# Patient Record
Sex: Male | Born: 1945 | Race: White | Hispanic: No | Smoking: Former smoker
Health system: Southern US, Community
[De-identification: ages and names within clinical notes are randomized; demographics above are authoritative.]

## PROBLEM LIST (undated history)

## (undated) DIAGNOSIS — E785 Hyperlipidemia, unspecified: Secondary | ICD-10-CM

## (undated) DIAGNOSIS — E119 Type 2 diabetes mellitus without complications: Secondary | ICD-10-CM

## (undated) DIAGNOSIS — I10 Essential (primary) hypertension: Secondary | ICD-10-CM

## (undated) DIAGNOSIS — I2510 Atherosclerotic heart disease of native coronary artery without angina pectoris: Secondary | ICD-10-CM

## (undated) DIAGNOSIS — I219 Acute myocardial infarction, unspecified: Secondary | ICD-10-CM

## (undated) HISTORY — DX: Hyperlipidemia, unspecified: E78.5

## (undated) HISTORY — DX: Essential (primary) hypertension: I10

## (undated) HISTORY — PX: CARDIAC SURGERY: SHX584

## (undated) HISTORY — DX: Acute myocardial infarction, unspecified: I21.9

## (undated) HISTORY — DX: Type 2 diabetes mellitus without complications: E11.9

## (undated) HISTORY — PX: TONSILLECTOMY AND ADENOIDECTOMY: SUR1326

## (undated) HISTORY — DX: Atherosclerotic heart disease of native coronary artery without angina pectoris: I25.10

---

## 1998-03-06 ENCOUNTER — Ambulatory Visit (HOSPITAL_BASED_OUTPATIENT_CLINIC_OR_DEPARTMENT_OTHER): Admission: RE | Admit: 1998-03-06 | Discharge: 1998-03-06 | Payer: Self-pay | Admitting: Urology

## 2004-03-29 ENCOUNTER — Ambulatory Visit: Payer: Self-pay | Admitting: Cardiology

## 2004-04-11 ENCOUNTER — Ambulatory Visit: Payer: Self-pay

## 2005-06-18 ENCOUNTER — Ambulatory Visit: Payer: Self-pay | Admitting: Cardiology

## 2005-12-10 ENCOUNTER — Observation Stay (HOSPITAL_COMMUNITY): Admission: EM | Admit: 2005-12-10 | Discharge: 2005-12-11 | Payer: Self-pay | Admitting: Emergency Medicine

## 2005-12-10 ENCOUNTER — Ambulatory Visit: Payer: Self-pay | Admitting: Cardiovascular Disease

## 2005-12-16 ENCOUNTER — Ambulatory Visit: Payer: Self-pay

## 2006-06-22 ENCOUNTER — Ambulatory Visit: Payer: Self-pay | Admitting: Cardiology

## 2006-09-20 IMAGING — CR DG CHEST 1V PORT
1 series · 1 of 1 positions shown · non-contrast
Comparison: none
 The heart size and mediastinal contours are within normal limits.  Both lungs are clear.

CLINICAL DATA: Chest pain.
 PORTABLE CHEST - 1 VIEW:

[view not recorded]
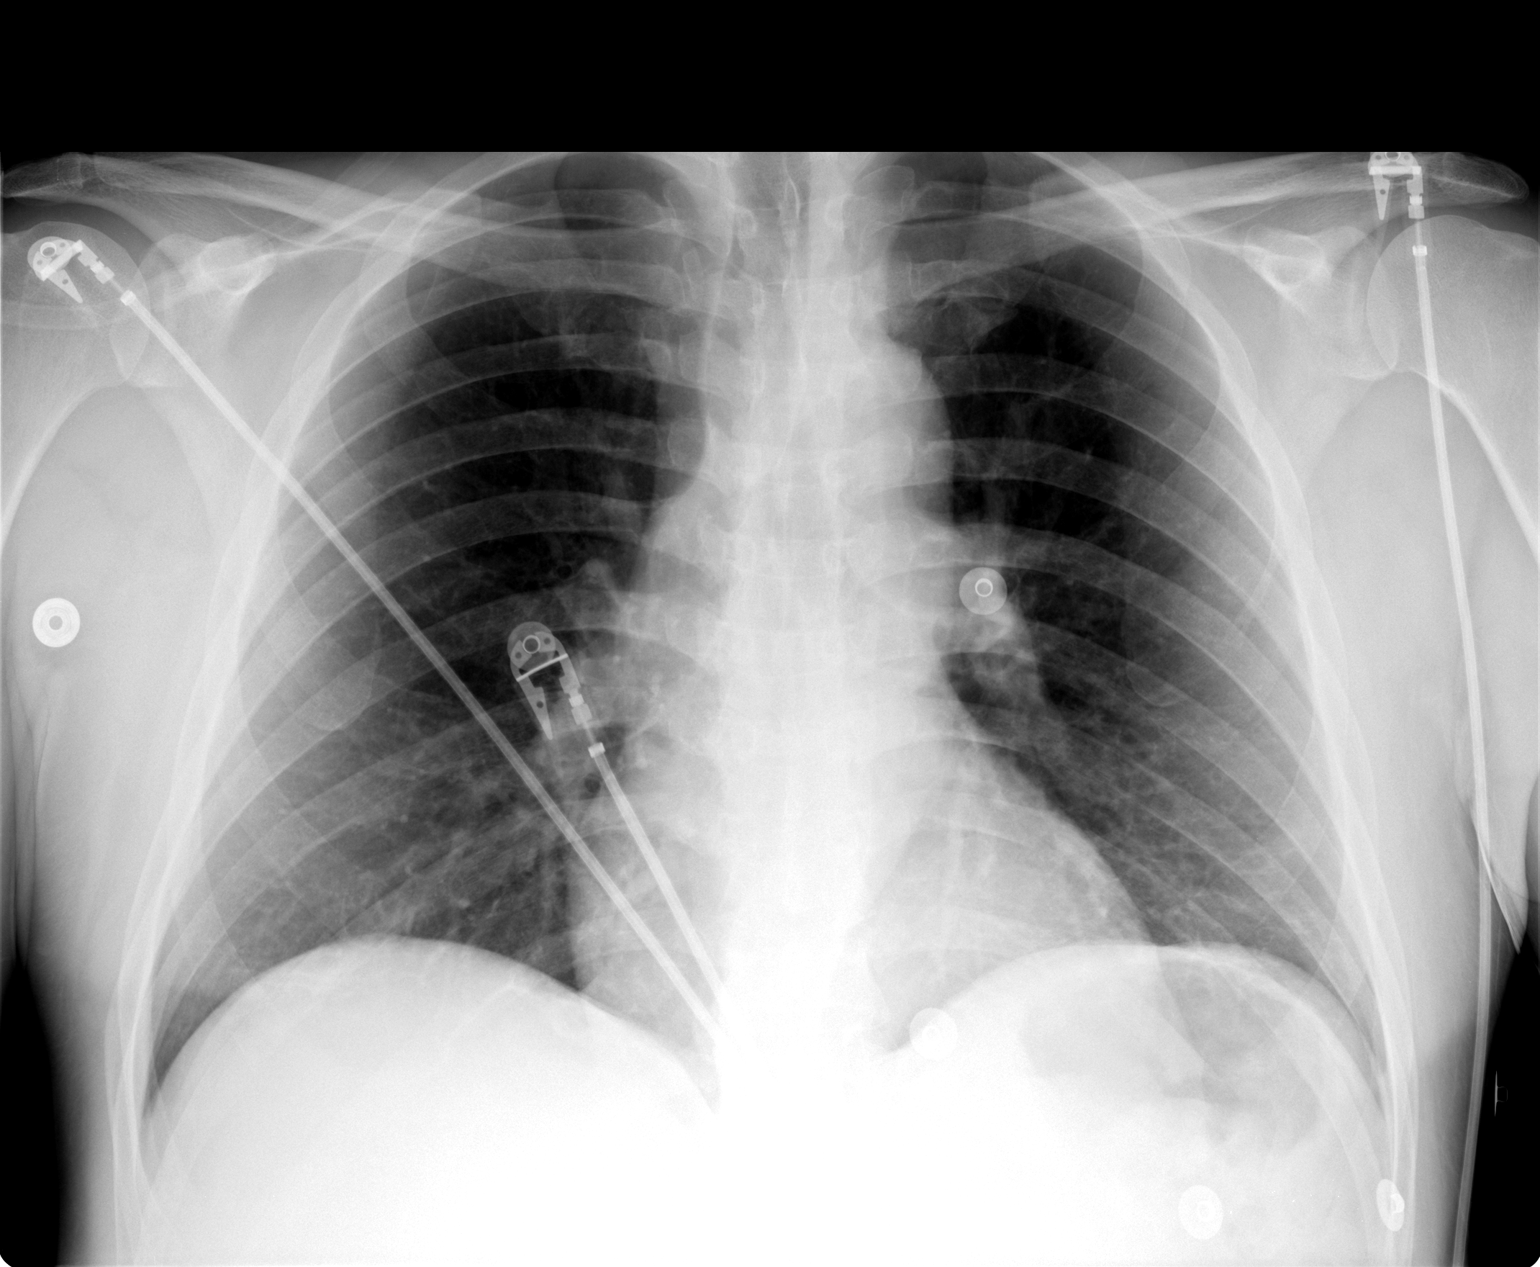

[1 of 1 positions shown; findings below may reference images not displayed]

IMPRESSION: No acute findings.

## 2006-12-10 ENCOUNTER — Ambulatory Visit: Payer: Self-pay | Admitting: Cardiology

## 2007-03-30 ENCOUNTER — Ambulatory Visit: Payer: Self-pay | Admitting: Cardiology

## 2007-06-29 ENCOUNTER — Ambulatory Visit: Payer: Self-pay | Admitting: Cardiology

## 2007-07-28 ENCOUNTER — Ambulatory Visit: Payer: Self-pay | Admitting: Cardiology

## 2007-11-24 ENCOUNTER — Ambulatory Visit: Payer: Self-pay | Admitting: Cardiology

## 2007-12-08 ENCOUNTER — Ambulatory Visit: Payer: Self-pay | Admitting: Cardiology

## 2007-12-08 LAB — CONVERTED CEMR LAB
BUN: 17 mg/dL (ref 6–23)
CO2: 29 meq/L (ref 19–32)
Calcium: 9.7 mg/dL (ref 8.4–10.5)
Creatinine, Ser: 0.9 mg/dL (ref 0.4–1.5)
Glucose, Bld: 126 mg/dL — ABNORMAL HIGH (ref 70–99)

## 2007-12-13 ENCOUNTER — Ambulatory Visit: Payer: Self-pay | Admitting: Cardiology

## 2008-03-29 ENCOUNTER — Ambulatory Visit: Payer: Self-pay | Admitting: Cardiology

## 2008-03-31 ENCOUNTER — Ambulatory Visit: Payer: Self-pay

## 2008-06-22 ENCOUNTER — Encounter: Payer: Self-pay | Admitting: Cardiology

## 2008-08-16 ENCOUNTER — Ambulatory Visit: Payer: Self-pay | Admitting: Cardiology

## 2008-09-06 ENCOUNTER — Encounter: Payer: Self-pay | Admitting: Cardiology

## 2008-12-01 ENCOUNTER — Encounter (INDEPENDENT_AMBULATORY_CARE_PROVIDER_SITE_OTHER): Payer: Self-pay | Admitting: *Deleted

## 2008-12-30 DIAGNOSIS — E782 Mixed hyperlipidemia: Secondary | ICD-10-CM

## 2008-12-30 DIAGNOSIS — E119 Type 2 diabetes mellitus without complications: Secondary | ICD-10-CM

## 2008-12-30 DIAGNOSIS — I10 Essential (primary) hypertension: Secondary | ICD-10-CM

## 2008-12-30 HISTORY — DX: Mixed hyperlipidemia: E78.2

## 2008-12-30 HISTORY — DX: Essential (primary) hypertension: I10

## 2009-01-03 ENCOUNTER — Ambulatory Visit: Payer: Self-pay | Admitting: Cardiology

## 2009-01-25 ENCOUNTER — Ambulatory Visit: Payer: Self-pay | Admitting: Cardiology

## 2009-04-10 ENCOUNTER — Ambulatory Visit: Payer: Self-pay | Admitting: Cardiology

## 2009-04-13 ENCOUNTER — Encounter (INDEPENDENT_AMBULATORY_CARE_PROVIDER_SITE_OTHER): Payer: Self-pay | Admitting: *Deleted

## 2009-06-20 ENCOUNTER — Encounter: Payer: Self-pay | Admitting: Cardiology

## 2009-08-09 ENCOUNTER — Ambulatory Visit: Payer: Self-pay | Admitting: Cardiology

## 2009-10-15 ENCOUNTER — Encounter: Payer: Self-pay | Admitting: Cardiology

## 2009-12-05 ENCOUNTER — Ambulatory Visit: Payer: Self-pay | Admitting: Cardiology

## 2009-12-19 DIAGNOSIS — I2510 Atherosclerotic heart disease of native coronary artery without angina pectoris: Secondary | ICD-10-CM

## 2009-12-19 HISTORY — DX: Atherosclerotic heart disease of native coronary artery without angina pectoris: I25.10

## 2009-12-21 ENCOUNTER — Ambulatory Visit: Payer: Self-pay | Admitting: Cardiology

## 2010-04-10 ENCOUNTER — Ambulatory Visit: Payer: Self-pay | Admitting: Cardiology

## 2010-06-13 NOTE — Assessment & Plan Note (Signed)
Summary: PER CHECK OUT/SF  Medications Added GLIMEPIRIDE 4 MG TABS (GLIMEPIRIDE) 1 tab once daily      Allergies Added: NKDA  Visit Type:  1 yr f/u Primary Provider:  Dr. Quintella Riddle  CC:  no cardiac complaints today.  History of Present Illness: [name] returns today for further evaluation and management of his coronary artery disease.  He lost his job as well as his insurance. He is very stressed out.  Denies any angina or ischemic symptoms. He is compliant with his medications.  Last laboratory data I have is from April 2010. Lipitor goal. Hemoglobin A1c was 6.7% on metformin and Actos.  He is no longer on Actos.   Clinical Reports Reviewed:  Cardiac Cath:  12/11/2005: Cardiac Cath Findings:   Left ventricular function is normal.  The left ventricular ejection fraction  is 60% by LV angiography.   ASSESSMENT:  1.  Diffuse coronary artery disease in a mild to moderate range of severity.  2.  Preserved left ventricular function.  Nuclear Study:  03/31/2008:  Excerise capacity: Excellent exercise capacity   Blood Pressure response: Hypertensive blood pressure response (184/79).  Clinical symptoms: Dyspnea without chest pain  ECG impression: Significant ST abnormalities consistent with ischemia  Overall impression: Abnormal stress nuclear study. Electrocardiographically abnormal in the absence of chest pain. There is apparent basal inferior scar without any large degree of ischemia.  Lance Sidle, MD   12/16/2005:  Excerise capacity: Good exercise capacity  Blood Pressure response: Mild hypertensive blood pressure response  Clinical symptoms: No chest pain or dyspnea  ECG impression: No ST abnormalities suggestive of ischemia  Overall impression: No ischemia. Slight decrease in thickening of the inferior wall at the base.  Willa Rough, MD   Current Medications (verified): 1)  Lisinopril-Hydrochlorothiazide 20-12.5 Mg Tabs  (Lisinopril-Hydrochlorothiazide) .Marland Kitchen.. 1 Tab Once Daily 2)  Aspirin 81 Mg Tbec (Aspirin) .... Take One Tablet By Mouth Daily 3)  Metformin Hcl 500 Mg Tabs (Metformin Hcl) .Marland Kitchen.. 1 Tab Two Times A Day 4)  Vitamin C 500 Mg Tabs (Ascorbic Acid) .Marland Kitchen.. 1 Tab Once Daily 5)  Multivitamins   Tabs (Multiple Vitamin) .Marland Kitchen.. 1 Tab Once Daily 6)  Norvasc 10 Mg Tabs (Amlodipine Besylate) .Marland Kitchen.. 1 Tab Once Daily 7)  Research Study Drug .... Take As Directed 8)  Nitroglycerin 0.4 Mg Subl (Nitroglycerin) .... One Tablet Under Tongue Every 5 Minutes As Needed For Chest Pain---May Repeat Times Three 9)  Glimepiride 4 Mg Tabs (Glimepiride) .Marland Kitchen.. 1 Tab Once Daily  Allergies (verified): No Known Drug Allergies  Past History:  Past Medical History: Last updated: 12/19/2009 CAD, NATIVE VESSEL (ICD-414.01) MYOCARDIAL INFARCTION/1994 (ICD-410.90) HYPERTENSION, UNSPECIFIED (ICD-401.9) HYPERLIPIDEMIA (ICD-272.4) DIABETES MELLITUS, TYPE II (ICD-250.00)    Family History: Last updated: 12/30/2008  remarkable for premature coronary artery disease on his  father's side.  Social History: Last updated: 12/30/2008 Tobacco Use - Yes.  pt chews  Risk Factors: Smoking Status: current (12/30/2008)  Review of Systems       negative other than history of present illness  Vital Signs:  Patient profile:   65 year old male Height:      65 inches Weight:      172.4 pounds BMI:     28.79 Pulse rate:   69 / minute Pulse rhythm:   regular BP sitting:   132 / 78  (left arm) Cuff size:   large  Vitals Entered By: Danielle Rankin, CMA (December 21, 2009 9:43 AM)  Physical Exam  General:  no acute distress, overweight Head:  normocephalic and atraumatic Eyes:  PERRLA/EOM intact; conjunctiva and lids normal. Neck:  Neck supple, no JVD. No masses, thyromegaly or abnormal cervical nodes. Chest Wall:  no deformities or breast masses noted Lungs:  Clear bilaterally to auscultation and percussion. Heart:  PMI nondisplaced,  regular rate and rhythm, no gallop. Normal S1-S2, carotid strokes equal bilateral without bruits Abdomen:  Bowel sounds positive; abdomen soft and non-tender without masses, organomegaly, or hernias noted. No hepatosplenomegaly. Msk:  Back normal, normal gait. Muscle strength and tone normal. Pulses:  pulses normal in all 4 extremities Extremities:  No clubbing or cyanosis. Neurologic:  Alert and oriented x 3. Skin:  Intact without lesions or rashes. Psych:  Normal affect.   EKG  Procedure date:  12/21/2009  Findings:      normal sinus rhythm, normal EKG  Impression & Recommendations:  Problem # 1:  CAD, NATIVE VESSEL (ICD-414.01) Assessment Unchanged stable. No indication for stress Myoview. Followup in a year. His updated medication list for this problem includes:    Lisinopril-hydrochlorothiazide 20-12.5 Mg Tabs (Lisinopril-hydrochlorothiazide) .Marland Kitchen... 1 tab once daily    Aspirin 81 Mg Tbec (Aspirin) .Marland Kitchen... Take one tablet by mouth daily    Norvasc 10 Mg Tabs (Amlodipine besylate) .Marland Kitchen... 1 tab once daily    Nitroglycerin 0.4 Mg Subl (Nitroglycerin) ..... One tablet under tongue every 5 minutes as needed for chest pain---may repeat times three  Problem # 2:  MYOCARDIAL INFARCTION/1994 (ICD-410.90) Assessment: Unchanged  His updated medication list for this problem includes:    Lisinopril-hydrochlorothiazide 20-12.5 Mg Tabs (Lisinopril-hydrochlorothiazide) .Marland Kitchen... 1 tab once daily    Aspirin 81 Mg Tbec (Aspirin) .Marland Kitchen... Take one tablet by mouth daily    Norvasc 10 Mg Tabs (Amlodipine besylate) .Marland Kitchen... 1 tab once daily    Nitroglycerin 0.4 Mg Subl (Nitroglycerin) ..... One tablet under tongue every 5 minutes as needed for chest pain---may repeat times three  Problem # 3:  HYPERTENSION, UNSPECIFIED (ICD-401.9) Assessment: Improved  His updated medication list for this problem includes:    Lisinopril-hydrochlorothiazide 20-12.5 Mg Tabs (Lisinopril-hydrochlorothiazide) .Marland Kitchen... 1 tab once  daily    Aspirin 81 Mg Tbec (Aspirin) .Marland Kitchen... Take one tablet by mouth daily    Norvasc 10 Mg Tabs (Amlodipine besylate) .Marland Kitchen... 1 tab once daily  Problem # 4:  HYPERLIPIDEMIA (ICD-272.4) his That primary care Will check and recently checked. I do not have the numbers.  Problem # 5:  DIABETES MELLITUS, TYPE II (ICD-250.00) Assessment: Deteriorated He has been advised to lose about 10 pounds. At that point we would obtain fasting lipids, comprehensive metabolic panel, hemoglobin A1c.He can have this done through his primary care doctor. The following medications were removed from the medication list:    Actos 45 Mg Tabs (Pioglitazone hcl) .Marland Kitchen... 1 tab once daily His updated medication list for this problem includes:    Lisinopril-hydrochlorothiazide 20-12.5 Mg Tabs (Lisinopril-hydrochlorothiazide) .Marland Kitchen... 1 tab once daily    Aspirin 81 Mg Tbec (Aspirin) .Marland Kitchen... Take one tablet by mouth daily    Metformin Hcl 500 Mg Tabs (Metformin hcl) .Marland Kitchen... 1 tab two times a day    Glimepiride 4 Mg Tabs (Glimepiride) .Marland Kitchen... 1 tab once daily  Patient Instructions: 1)  Your physician recommends that you schedule a follow-up appointment in: 1 year with Dr. Daleen Squibb 2)  Your physician recommends that you continue on your current medications as directed. Please refer to the Current Medication list given to you today. 3)  Your physician encouraged you to lose weight  for better health.  Try to lose 10 pounds over the next year. 4)  Follow up with your primary care doctor for lab work.

## 2010-08-08 ENCOUNTER — Encounter (INDEPENDENT_AMBULATORY_CARE_PROVIDER_SITE_OTHER): Payer: Self-pay

## 2010-08-08 DIAGNOSIS — R0989 Other specified symptoms and signs involving the circulatory and respiratory systems: Secondary | ICD-10-CM

## 2010-09-24 NOTE — Assessment & Plan Note (Signed)
[facility]                            CARDIOLOGY OFFICE NOTE   NAME:Lance Riddle, Lance Riddle                       MRN:          [id number]  DATE:12/13/2007                            DOB:          07/08/1945    Lance Riddle returns today for followup.   PROBLEM LIST:  1. Coronary artery disease.  He is status post acute inferior wall      infarct in 1994 with emergency angioplasty and V-fib arrest.  For      exertional angina, had a cardiac cath on December 11, 2005, which      showed nonobstructive disease, but diffuse disease and a small      evidence of right coronary that had about a 60-70% in the proximal      RCA.  Subsequent stress Myoview showed no ischemia.  He has done      remarkably well.  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  2. Hyperlipidemia, followed by Dr. Purnell Shoemaker.  3. Type 2 diabetes.  4. Hypertension.  5. Hyperlipidemia, on IMPROVE-IT study.   He denies any angina, shortness of breath, dyspnea on exertion,  orthopnea, PND, or peripheral edema.   MEDICATIONS:  1. Lisinopril HCTZ 20/12.5 daily.  2. Aspirin 81 mg a day.  3. Metformin 500 b.i.d.  4. Norvasc 10 mg a day.  5. Actos 45 mg a day.  6. IMPROVE-IT study drug.   He has some blood work done recently as a part of his study that showed  a potassium of 5.3 borderline high, repeated was 4.4.  His creatinine  was 0.9.   PHYSICAL EXAMINATION:  VITAL SIGNS:  Blood pressure is 110/60, his pulse  is 72 and regular, his weight is 168.  HEENT:  Normocephalic, atraumatic.  PERRLA.  Extraocular movements  intact.  Sclerae are clear.  Facial symmetry is normal.  NECK:  Carotid upstrokes are equal bilaterally without bruits, no JVD.  Thyroid is not enlarged.  Trachea is midline.  LUNGS:  Clear.  HEART:  Reveals a regular rate and rhythm.  No gallop, rub, or murmur.  ABDOMEN:  Soft.  Good bowel sounds.  No midline bruit, no hepatomegaly.  EXTREMITIES:  No cyanosis, clubbing, or edema.  Pulses are brisk.  NEURO:  Intact.  MUSCULOSKELETAL:  Intact.  SKIN:  Unremarkable.   ASSESSMENT AND PLAN:  Lance Riddle is doing well.  I have advised him to  have an exercise rest/stress Myoview since he such as a heavy duty  worker and has this borderline lesion in his right coronary artery.  If  he does not show any ischemia, we will continue with current medical  therapy.  I will see him back in a year.  All of his meds were renewed.     Thomas C. Daleen Squibb, MD, [facility]  Electronically Signed    TCW/MedQ  DD: 12/13/2007  DT: 12/14/2007  Job #: [id number]

## 2010-09-24 NOTE — Assessment & Plan Note (Signed)
[facility]                            CARDIOLOGY OFFICE NOTE   NAME:Hardgrave, COMPTON BRIGANCE                       MRN:          [id number]  DATE:06/29/2007                            DOB:          07-22-1945    Mr. Harig comes in today for follow-up.   PROBLEM LIST:  1. Coronary disease.  He is status post acute inferior wall infarct in      1994 with emergency angioplasty and V fib arrest.  He is having no      angina.  Cardiac cath December 11, 2005 for chest discomfort showed      nonobstructive disease.  Stress Myoview shortly thereafter showed      hypertension with blood pressure response to exercise and no      ischemia.  His ejection fraction was 57%.  2. Hyperlipidemia followed by Dr. Kathrynn Humble.  3. Type 2 diabetes on metformin and Actos.  4. Hypertension.  5. Hyperlipidemia on the IMPROVE-IT study.   Denies any angina, shortness of breath, dyspnea on exertion, orthopnea,  PND, peripheral edema.   EXAM:  Blood pressure is 126/78, his pulse 73 and regular, weighs 168.  HEENT:  Normocephalic, atraumatic.  PERRL.  Extraocular movements  intact.  Sclera clear.  Facial symmetry is normal.  Dentition  satisfactory.  Neck is supple.  Carotids are equal bilaterally without bruits, no JVD.  Thyroid is not enlarged.  Trachea is midline.  LUNGS:  Clear to auscultation.  HEART:  Reveals a nondisplaced PMI.  Normal S1-S2 without gallop.  ABDOMEN:  Soft, good bowel sounds.  No midline bruit.  No hepatomegaly.  EXTREMITIES:  No sinus clubbing or edema.  Pulses are present  bilaterally lower extremities.  NEURO:  Exam is intact.  Skin is unremarkable.   His EKG today is essentially normal.   ASSESSMENT/PLAN:  Mr. Kornegay is doing well.  We will arrange and have a  stress Myoview in August.  I have made no changes in his medical  program.  He will continue with IMPROVE-IT study, which hopefully will  show favorable outcome for extreme LDL lowering.  I will  see him back  again after his Myoview.    Thomas C. Daleen Squibb, MD, [facility]  Electronically Signed   TCW/MedQ  DD: 06/29/2007  DT: 06/29/2007  Job #: [id number]   cc:   Lianne Bushy, M.D.

## 2010-09-27 NOTE — Discharge Summary (Signed)
Lance Riddle, Lance Riddle NO.:  [id number]   MEDICAL RECORD NO.:  [id number]          PATIENT TYPE:  INP   LOCATION:  3799                         FACILITY:  [facility]   PHYSICIAN:  Bevelyn Buckles. Bensimhon, MDDATE OF BIRTH:  [birth year]   DATE OF ADMISSION:  12/10/2005  DATE OF DISCHARGE:  12/11/2005                                 DISCHARGE SUMMARY   PRIMARY CARDIOLOGIST:  Dr. Juanito Doom.   PRIMARY CARE PHYSICIAN:  Dr. Purnell Shoemaker.   PRINCIPAL DIAGNOSIS:  Chest pain.   SECONDARY DIAGNOSES:  1. Hypertension.  2. Hyperlipidemia.  3. Type 2 diabetes mellitus.  4. Coronary artery disease status post myocardial infarction in 1994.  5. Family history of premature coronary artery disease on his father's      side.   ALLERGIES:  No known drug allergies.   PROCEDURES:  Left heart cardiac catheterization.   HISTORY OF PRESENT ILLNESS:  A 65 year old male with a prior history of CAD  status post MI in 1994 who was followed by Dr. Daleen Riddle in the office.  He had  been having increasing substernal chest pain, thus prompting him to present  to the [facility] ED on December 10, 2005.  In the ED, EKG was without acute  changes and cardiac markers were negative.  Because of the similarity of his  chest discomfort to previous angina, the decision was made to admit him for  left heart cardiac catheterization.   HOSPITAL COURSE:  [name] underwent left heart cardiac catheterization on  August 2 revealing diffuse mild to moderate coronary artery disease with  normal LV function with an EF of 60%.  The decision was made to discharge  [name] with plan for an outpatient Myoview scheduled for December 16, 2005  at [facility].   DISCHARGE LABS:  Hemoglobin 12.2, hematocrit 36.9, WBC 7.3, platelets 179.  Sodium 142, potassium 4.1, chloride 110, CO2 28, BUN 26, creatinine 0.9,  glucose 113, calcium 8.9. CK 69, MB less than 1.0, troponin I less than  0.05. Total cholesterol 147, triglycerides  95, HDL 55, LDL 73.   DISPOSITION:  The patient is being discharged home today in good condition.   FOLLOW-UP APPOINTMENTS:  He has a follow-up exercise Myoview on December 16, 2005 at 12:30 p.m.  He will follow-up with Dr. Daleen Riddle following that.   DISCHARGE MEDICATIONS:  1. Aspirin 81 mg daily.  2. Avandia 4 mg b.i.d.  3. Glucophage 500 mg 2 tablets daily.  4. Vitamin C 500 mg daily.  5. Caduet 10-20 mg daily.  6. Lisinopril HCTZ 20/12.5 mg daily.  7. Multivitamin one daily.   OUTSTANDING LAB STUDIES:  None.   DURATION DISCHARGE ENCOUNTER:  35 minutes.     ______________________________  Nicolasa Ducking, ANP      Bevelyn Buckles. Bensimhon, MD  Electronically Signed    CB/MEDQ  D:  01/06/2006  T:  01/07/2006  Job:  [id number]

## 2010-09-27 NOTE — Cardiovascular Report (Signed)
NAMEANASTASIOS, MELANDER NO.:  [id number]   MEDICAL RECORD NO.:  [id number]          PATIENT TYPE:  INP   LOCATION:  3735                         FACILITY:  [facility]   PHYSICIAN:  Micheline Chapman, MD   DATE OF BIRTH:  [birth year]   DATE OF PROCEDURE:  12/11/2005  DATE OF DISCHARGE:                              CARDIAC CATHETERIZATION   PROCTORING PHYSICIAN:  Arvilla Meres, M.D.   PROCEDURES:  1.  Left heart catheterization.  2.  Coronary angiogram.  3.  Left ventricular angiogram.  4.  Angio-Seal of the right femoral artery.   INDICATIONS:  Mr. Bostock is a very pleasant, 65 year old male with  cardiovascular risk factors of hypertension, diabetes and dyslipidemia.  He  also has a history of prior myocardial infarction in 1994.  He presents with  crescendo angina.  With his presentation as well as known coronary artery  disease and significant risk factors, we proceeded with cardiac  catheterization.   DESCRIPTION OF PROCEDURE:  Risks and indications of the procedure were  explained to the patient in detail.  He fully understood and informed  consent was obtained.  The right groin was prepped and draped in normal  sterile fashion.  Anesthetized with 1% lidocaine and conscious sedation was  given with 1 mg of Versed and 25 mcg of Fentanyl.  Using the modified  Seldinger technique, right femoral arterial access was obtained and a 6-  [place] arterial sheath was placed.  Catheters used included a 6-French  pigtail catheter, JL-4 and JR-4.  At that conclusion of the case, a 6-  [place], right femoral artery Angio-Seal was placed for arterial closure.   FINDINGS:  Aortic pressure was 85/52 with a mean of 67.  Left ventricular  systolic pressure was 81.  End-diastolic pressure was 6 mmHg.   Left coronary artery:  The left mainstem is normal.  It branches into a left  anterior descending that courses down to the apex.  This gives off two large  septal perforators and  two small diagonal branches.  The second diagonal has  a 70% ostial stenosis and again is a very small diameter vessel.  The left  anterior descending through its proximal segments appear normal.  There is  20-30% stenosis in the mid LAD and distal LAD appears to have only  nonobstructive luminal irregularity.  There is a bifurcating ramus branch  that is small in diameter and has a 40% mid stenosis.  The left circumflex  gives off a first marginal branch as well as a left posterolateral branch,  both of which just show nonobstructive plaque.   Right coronary artery:  The proximal right coronary artery has luminal  irregularities.  The mid right coronary artery has diffuse 30% disease.  The  right coronary artery gives off a large RV marginal branch and at the  junction of the RCA and RV marginal branch, there is a 60-70% stenosis.  The  distal right coronary artery has luminal irregularities and branches into a  dominant PDA and posterior AV segment.  The right coronary artery is a 2.0  mm vessel.   Left ventricular function is normal.  The left ventricular ejection fraction  is 60% by LV angiography.   ASSESSMENT:  1.  Diffuse coronary artery disease in a mild to moderate range of severity.  2.  Preserved left ventricular function.   PLAN:  Reviewed the case with patient's primary cardiologist, Dr. Valera Castle.  We will proceed with an exercise stress Cardiolite next week to  evaluate for inferior ischemia in the setting of his moderate lesion to the  mid right coronary artery.  If his stress test is positive, we could  approach is right coronary artery with a coronary intervention.  The vessel  is diffusely diseased and fairly small diameter and may necessitate  treatment with a small bare metal stent if intervention is warranted.   The patient's heparin and Integrilin were discontinued and will plan on  discharge after his post catheterization observation.      Micheline Chapman, MD  Electronically Signed     MDC/MEDQ  D:  12/11/2005  T:  12/11/2005  Job:  [id number]   cc:   Thomas C. Wall, M.D.  Lianne Bushy, M.D.

## 2010-09-27 NOTE — Assessment & Plan Note (Signed)
[facility]                            CARDIOLOGY OFFICE NOTE   NAME:Weinrich, ERAN MISTRY                       MRN:          [id number]  DATE:06/22/2006                            DOB:          [birth year]    Mr. Geffre returns today for further management of his coronary artery  disease.   PROBLEM LIST:  Coronary artery disease.  He came into [facility]  December 11, 2005, and had chest discomfort.  Cardiac catheterization  showed diffuse nonobstructive coronary disease.  It was decided to treat  him medically.   He underwent a stress Myoview on December 16, 2005, which showed a  hypertensive blood pressure response to exercise.  There was no  ischemia.  EF was 57%.   He has been having no angina or ischemic symptoms.  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  That is  good in these times.   His medications are:  1. Lisinopril/hydrochlorothiazide 20/12.5 daily.  2. Aspirin 81 mg a day.  3. Metformin 500 b.i.d.  4. Multivitamins.  5. Norvasc 10 mg a day.  6. Actos 45 mg a day.   His blood pressure today is 120/90, his pulse 64 and regular, his weight  is 166.  HEENT normocephalic, atraumatic, PERRLA, extraocular movements  intact, sclerae clear.  Facial symmetry is normal.  Carotid upstrokes  are equal bilaterally without bruits, there is no JVD.  Thyroid is not  enlarged, trachea is midline.  Lungs are clear to auscultation and  percussion.  Heart reveals a regular rate and rhythm without gallop.  Abdominal exam is soft with good bowel sounds, no midline bruit, there  is no hepatomegaly.  Extremities show no cyanosis, clubbing or edema.  Pulses are brisk.  Neurologic exam is intact.   EKG is completely normal except for slight, maybe left atrial  enlargement.   ASSESSMENT AND PLAN:  Mr. Paparella is doing well.  We will continue with  his current medical program.  Renewed his medications.  I will see him  back in a year unless there are problems in the interim.     Thomas C. Daleen Squibb, MD, [facility]  Electronically Signed   TCW/MedQ  DD: 06/22/2006  DT: 06/22/2006  Job #: [id number]   cc:   Lianne Bushy, M.D.

## 2010-09-27 NOTE — H&P (Signed)
NAMEGAYLEN, Lance Riddle NO.:  [id number]   MEDICAL RECORD NO.:  [id number]          PATIENT TYPE:  EMS   LOCATION:  MAJO                         FACILITY:  [facility]   PHYSICIAN:  Charlton Haws, M.D.     DATE OF BIRTH:  1945-10-24   DATE OF ADMISSION:  12/10/2005  DATE OF DISCHARGE:                                HISTORY & PHYSICAL   Lance Riddle is a patient of Dr. Purnell Shoemaker and Dr. Daleen Squibb.  He had a myocardial  infarction in 1994.  There are no records in [place].   He tells me he has been doing perfectly fine since that time.  He sees Dr.  Daleen Squibb regularly.  He says he had a stress test last year which was okay.   His coronary risk factors include hypercholesterolemia, hypertension and  diabetes.   He has been having increasing substernal chest pain since Saturday.  They  have been relieved with nitro.  He came to the ER today for further  evaluation.   He is currently pain free.  He was given aspirin on the way in.   He has no acute EKG changes and his point of care markers are negative.   On review of systems he has no significant syncope, palpitations, PND,  orthopnea or dyspnea.   He is still fairly active.  He works every day as a Nutritional therapist.   He is happily married.  He has two sons, one who does trim carpentry and one  who is a Curator.  He lives in [place].   His past medical history is remarkable for diabetes which he takes multiple  oral hypoglycemics for.  He has hypertension, on treatment, and  hyperlipidemia.   He has not had any previous surgery.   He has not had any angina up until Saturday, since his heart attack in 1994.   HE DENIES ANY ALLERGIES.   Family history is remarkable for premature coronary artery disease on his  father's side.   On exam blood pressure is 130/70, pulse 70 and regular.  Lungs were clear.  Cardiovascular shows S1-S2 with normal heart sounds.  Abdomen is benign.  Lower extremities have adequate pulses and no edema.   Hematocrit is 38.6,  platelets are 199.  CPK and troponin are negative.  Chest x-ray shows no  active disease, no cardiomegaly.   EKG is normal with no acute changes.   IMPRESSION:  The patient has crescendo angina.  He will be placed on heparin  and Integrilin.   We will have to hold his Glucophage for cath.  Would also like to get him  off his Avandia given the recent reports of increased cardiovascular risk  with it.  Suspect that given these two things we may need a diabetes consult  or have Dr. Purnell Shoemaker see the patient to adjust his diabetes medicines.  We will  cover his sugars with sliding scale insulin.   I would also like to stop his calcium blocker and diuretic.   We will start Lopressor at 25 b.i.d.   Would also, at some point, like to  add an ACE inhibitor since he is a  diabetic.   Further recommendations will be based on the results of his heart cath.   Patient is currently pain free and stable.           ______________________________  Charlton Haws, M.D.     PN/MEDQ  D:  12/10/2005  T:  12/10/2005  Job:  [id number]

## 2010-11-28 ENCOUNTER — Encounter: Payer: Self-pay | Admitting: Cardiology

## 2010-12-16 ENCOUNTER — Encounter (INDEPENDENT_AMBULATORY_CARE_PROVIDER_SITE_OTHER): Payer: Self-pay

## 2010-12-16 DIAGNOSIS — R0989 Other specified symptoms and signs involving the circulatory and respiratory systems: Secondary | ICD-10-CM

## 2011-01-01 ENCOUNTER — Ambulatory Visit (INDEPENDENT_AMBULATORY_CARE_PROVIDER_SITE_OTHER): Payer: Self-pay | Admitting: Cardiology

## 2011-01-01 ENCOUNTER — Encounter: Payer: Self-pay | Admitting: Cardiology

## 2011-01-01 VITALS — BP 124/79 | HR 66 | Resp 12 | Ht 64.0 in | Wt 163.0 lb

## 2011-01-01 DIAGNOSIS — I2510 Atherosclerotic heart disease of native coronary artery without angina pectoris: Secondary | ICD-10-CM

## 2011-01-01 MED ORDER — AMLODIPINE BESYLATE 10 MG PO TABS: 10.0000 mg | ORAL_TABLET | Freq: Every day | ORAL | Status: DC

## 2011-01-01 MED ORDER — LISINOPRIL-HYDROCHLOROTHIAZIDE 20-12.5 MG PO TABS: 1.0000 | ORAL_TABLET | Freq: Every day | ORAL | Status: DC

## 2011-01-01 NOTE — Assessment & Plan Note (Signed)
Stable. Continue current meds. Followup with [place] in the year.

## 2011-01-01 NOTE — Progress Notes (Signed)
HPI Mr. Brinkley returns today for evaluation and management of his coronary disease. He is 19 years out from an acute MI complicated by V. Fib arrest.  He's having no angina or ischemic symptoms. No palpitations presyncope or syncope.  He's compliant with his meds. Meds renewed today. Laboratory data followed by his primary care Dr. Marina Goodell. Past Medical History  Diagnosis Date  . Coronary artery disease     native vessel  . MI (myocardial infarction)   . Hypertension     unspecified  . Hyperlipidemia   . Diabetes mellitus     type 2    No past surgical history on file.  Family History  Problem Relation Age of Onset  . Coronary artery disease Other     History   Social History  . Marital Status: Married    Spouse Name: N/A    Number of Children: N/A  . Years of Education: N/A   Occupational History  . Not on file.   Social History Main Topics  . Smoking status: Former Games developer  . Smokeless tobacco: Current User    Types: Chew  . Alcohol Use: Not on file  . Drug Use: Not on file  . Sexually Active: Not on file   Other Topics Concern  . Not on file   Social History Narrative  . No narrative on file    No Known Allergies  Current Outpatient Prescriptions  Medication Sig Dispense Refill  . amLODipine (NORVASC) 10 MG tablet Take 10 mg by mouth daily.        Marland Kitchen aspirin 81 MG tablet Take 81 mg by mouth daily.        Marland Kitchen glimepiride (AMARYL) 4 MG tablet Take 4 mg by mouth daily.        Marland Kitchen lisinopril-hydrochlorothiazide (PRINZIDE,ZESTORETIC) 20-12.5 MG per tablet Take 1 tablet by mouth daily.        . metFORMIN (GLUCOPHAGE) 500 MG tablet Take 500 mg by mouth 2 (two) times daily.        . multivitamin (THERAGRAN) per tablet Take 1 tablet by mouth daily.        . nitroGLYCERIN (NITROSTAT) 0.4 MG SL tablet Place 0.4 mg under the tongue every 5 (five) minutes as needed.        . NON FORMULARY RESEARCH STUDY DRUG Take as directed by research nurse       . vitamin C (ASCORBIC  ACID) 500 MG tablet Take 500 mg by mouth daily.          ROS Negative other than HPI.   PE General Appearance: well developed, well nourished in no acute distress HEENT: symmetrical face, PERRLA, good dentition  Neck: no JVD, thyromegaly, or adenopathy, trachea midline Chest: symmetric without deformity Cardiac: PMI non-displaced, RRR, normal S1, S2, no gallop or murmur Lung: clear to ausculation and percussion Vascular: all pulses full without bruits  Abdominal: nondistended, nontender, good bowel sounds, no HSM, no bruits Extremities: no cyanosis, clubbing or edema, no sign of DVT, no varicosities  Skin: normal color, no rashes Neuro: alert and oriented x 3, non-focal Pysch: normal affect Filed Vitals:   01/01/11 0928  BP: 124/79  Pulse: 66  Resp: 12  Height: 5\' 4"  (1.626 m)  Weight: 163 lb (73.936 kg)    EKG  Labs and Studies Reviewed.   No results found for this basename: WBC, HGB, HCT, MCV, PLT      Chemistry      Component Value Date/Time   NA 141  12/08/2007 0821   K 4.4 12/08/2007 0821   CL 105 12/08/2007 0821   CO2 29 12/08/2007 0821   BUN 17 12/08/2007 0821   CREATININE 0.9 12/08/2007 0821      Component Value Date/Time   CALCIUM 9.7 12/08/2007 0821       No results found for this basename: CHOL   No results found for this basename: HDL   No results found for this basename: LDLCALC   No results found for this basename: TRIG   No results found for this basename: CHOLHDL   No results found for this basename: HGBA1C   No results found for this basename: ALT, AST, GGT, ALKPHOS, BILITOT   No results found for this basename: TSH

## 2011-01-01 NOTE — Patient Instructions (Signed)
The current medical regimen is effective;  continue present plan and medications.  Follow up in 1 year with Dr Wall.  You will receive a letter in the mail 2 months before you are due.  Please call us when you receive this letter to schedule your follow up appointment.  

## 2011-12-11 ENCOUNTER — Encounter (INDEPENDENT_AMBULATORY_CARE_PROVIDER_SITE_OTHER): Payer: Self-pay

## 2011-12-11 DIAGNOSIS — R0989 Other specified symptoms and signs involving the circulatory and respiratory systems: Secondary | ICD-10-CM

## 2012-01-12 ENCOUNTER — Other Ambulatory Visit: Payer: Self-pay | Admitting: Cardiology

## 2012-01-21 ENCOUNTER — Encounter: Payer: Self-pay | Admitting: *Deleted

## 2012-01-29 ENCOUNTER — Ambulatory Visit (INDEPENDENT_AMBULATORY_CARE_PROVIDER_SITE_OTHER): Payer: Self-pay | Admitting: Cardiology

## 2012-01-29 ENCOUNTER — Encounter: Payer: Self-pay | Admitting: Cardiology

## 2012-01-29 VITALS — BP 144/70 | Ht 64.0 in | Wt 168.0 lb

## 2012-01-29 DIAGNOSIS — I2510 Atherosclerotic heart disease of native coronary artery without angina pectoris: Secondary | ICD-10-CM

## 2012-01-29 DIAGNOSIS — I10 Essential (primary) hypertension: Secondary | ICD-10-CM

## 2012-01-29 DIAGNOSIS — E785 Hyperlipidemia, unspecified: Secondary | ICD-10-CM

## 2012-01-29 MED ORDER — AMLODIPINE BESYLATE 10 MG PO TABS: 10.0000 mg | ORAL_TABLET | Freq: Every day | ORAL | Status: DC

## 2012-01-29 NOTE — Addendum Note (Signed)
Addended by: Lisabeth Devoid F on: 01/29/2012 09:46 AM   Modules accepted: Orders

## 2012-01-29 NOTE — Assessment & Plan Note (Signed)
Stable. Continue secondary preventative therapy. Return to see [place] in one year or when necessary.

## 2012-01-29 NOTE — Progress Notes (Signed)
HPI Lance Riddle returns today for evaluation and management of his history of coronary artery disease and previous anterior myocardial infarction. This was nearly 20 years ago. He has had no recurrent events.  He is extremely compliant. Blood sugars or running around the 100 and his last A1c he states was less than 7%. He is on one of our lipid study drugs with [employer].   He is having no angina or ischemic symptoms. He does not want an EKG because he does not have any insurance.  Past Medical History  Diagnosis Date  . Coronary atherosclerosis of native coronary artery   . Acute myocardial infarction, unspecified site, episode of care unspecified   . Unspecified essential hypertension   . Other and unspecified hyperlipidemia   . Type II or unspecified type diabetes mellitus without mention of complication, not stated as uncontrolled     type 2    Current Outpatient Prescriptions  Medication Sig Dispense Refill  . amLODipine (NORVASC) 10 MG tablet TAKE ONE TABLET BY MOUTH EVERY DAY  30 tablet  0  . aspirin 81 MG tablet Take 81 mg by mouth daily.        . finasteride (PROSCAR) 5 MG tablet Take 5 mg by mouth daily.      Marland Kitchen glimepiride (AMARYL) 4 MG tablet Take 4 mg by mouth daily.        Marland Kitchen lisinopril-hydrochlorothiazide (PRINZIDE,ZESTORETIC) 20-12.5 MG per tablet Take 1 tablet by mouth daily.  90 tablet  3  . metFORMIN (GLUCOPHAGE) 500 MG tablet Take 500 mg by mouth 2 (two) times daily.        . multivitamin (THERAGRAN) per tablet Take 1 tablet by mouth daily.        . nitroGLYCERIN (NITROSTAT) 0.4 MG SL tablet Place 0.4 mg under the tongue every 5 (five) minutes as needed.        . NON FORMULARY RESEARCH STUDY DRUG Take as directed by research nurse       . vitamin C (ASCORBIC ACID) 500 MG tablet Take 500 mg by mouth daily.          No Known Allergies  Family History  Problem Relation Age of Onset  . Coronary artery disease Other     paternal side of family    History    Social History  . Marital Status: Married    Spouse Name: N/A    Number of Children: N/A  . Years of Education: N/A   Occupational History  . Not on file.   Social History Main Topics  . Smoking status: Former Games developer  . Smokeless tobacco: Current User    Types: Chew  . Alcohol Use: Not on file  . Drug Use: Not on file  . Sexually Active: Not on file   Other Topics Concern  . Not on file   Social History Narrative  . No narrative on file    ROS ALL NEGATIVE EXCEPT THOSE NOTED IN HPI  PE  General Appearance: well developed, well nourished in no acute distress HEENT: symmetrical face, PERRLA, good dentition  Neck: no JVD, thyromegaly, or adenopathy, trachea midline Chest: symmetric without deformity Cardiac: PMI non-displaced, RRR, normal S1, S2, no gallop or murmur Lung: clear to ausculation and percussion Vascular: all pulses full without bruits  Abdominal: nondistended, nontender, good bowel sounds, no HSM, no bruits Extremities: no cyanosis, clubbing or edema, no sign of DVT, no varicosities  Skin: normal color, no rashes Neuro: alert and oriented x 3, non-focal  Pysch: normal affect  EKG  BMET    Component Value Date/Time   NA 141 12/08/2007 0821   K 4.4 12/08/2007 0821   CL 105 12/08/2007 0821   CO2 29 12/08/2007 0821   GLUCOSE 126* 12/08/2007 0821   BUN 17 12/08/2007 0821   CREATININE 0.9 12/08/2007 0821   CALCIUM 9.7 12/08/2007 0821   GFRNONAA 91 12/08/2007 0821   GFRAA 111 12/08/2007 0821    Lipid Panel  No results found for this basename: chol, trig, hdl, cholhdl, vldl, ldlcalc    CBC No results found for this basename: wbc, rbc, hgb, hct, plt, mcv, mch, mchc, rdw, neutrabs, lymphsabs, monoabs, eosabs, basosabs

## 2012-01-29 NOTE — Patient Instructions (Addendum)
Your physician recommends that you continue on your current medications as directed. Please refer to the Current Medication list given to you today.   Your physician wants you to follow-up in: 1 year with Dr. Wall. You will receive a reminder letter in the mail two months in advance. If you don't receive a letter, please call our office to schedule the follow-up appointment.  

## 2012-10-05 ENCOUNTER — Encounter (INDEPENDENT_AMBULATORY_CARE_PROVIDER_SITE_OTHER): Payer: Self-pay

## 2012-10-05 DIAGNOSIS — R0989 Other specified symptoms and signs involving the circulatory and respiratory systems: Secondary | ICD-10-CM

## 2012-10-08 ENCOUNTER — Telehealth: Payer: Self-pay | Admitting: *Deleted

## 2012-10-08 ENCOUNTER — Other Ambulatory Visit: Payer: Self-pay | Admitting: *Deleted

## 2012-10-08 MED ORDER — SIMVASTATIN 40 MG PO TABS: 40.0000 mg | ORAL_TABLET | Freq: Every day | ORAL | Status: DC

## 2012-10-08 NOTE — Telephone Encounter (Signed)
Patient completed the Improve It Study. Prescription for simvastatin 40 mg at bedtime, 30 day supply sent to [facility] in [place].  Subject to follow up with PCP Dr Marina Goodell in [place], [facility] for future refills.  Patient verbalizes understanding.

## 2012-11-22 ENCOUNTER — Encounter: Payer: Self-pay | Admitting: Cardiology

## 2012-12-06 ENCOUNTER — Other Ambulatory Visit: Payer: Self-pay | Admitting: Cardiology

## 2013-01-28 ENCOUNTER — Ambulatory Visit: Payer: Self-pay | Admitting: Cardiology

## 2013-02-08 ENCOUNTER — Ambulatory Visit (INDEPENDENT_AMBULATORY_CARE_PROVIDER_SITE_OTHER): Payer: Medicare Other | Admitting: Cardiology

## 2013-02-08 ENCOUNTER — Encounter: Payer: Self-pay | Admitting: Cardiology

## 2013-02-08 VITALS — BP 125/79 | HR 62 | Ht 64.0 in | Wt 166.2 lb

## 2013-02-08 DIAGNOSIS — I10 Essential (primary) hypertension: Secondary | ICD-10-CM

## 2013-02-08 DIAGNOSIS — I2510 Atherosclerotic heart disease of native coronary artery without angina pectoris: Secondary | ICD-10-CM

## 2013-02-08 DIAGNOSIS — E119 Type 2 diabetes mellitus without complications: Secondary | ICD-10-CM

## 2013-02-08 NOTE — Patient Instructions (Addendum)
The current medical regimen is effective;  continue present plan and medications.  Your physician has requested that you have an exercise tolerance test. For further information please visit [email or web address]. Please also follow instruction sheet, as given.   

## 2013-02-08 NOTE — Progress Notes (Signed)
HPI The patient presents for followup of his known coronary disease. He previously saw Dr. Daleen Squibb.  Since he was last seen he has done well. He walks a couple of miles every day. He works in his garden. He denies any cardiovascular symptoms. The patient denies any new symptoms such as chest discomfort, neck or arm discomfort. There has been no new shortness of breath, PND or orthopnea. There have been no reported palpitations, presyncope or syncope.  I did review a cardiac cath in 2007.  No Known Allergies  Current Outpatient Prescriptions  Medication Sig Dispense Refill  . amLODipine (NORVASC) 10 MG tablet Take 1 tablet (10 mg total) by mouth daily.  90 tablet  3  . aspirin 81 MG tablet Take 81 mg by mouth daily.        . finasteride (PROSCAR) 5 MG tablet Take 5 mg by mouth daily.      Marland Kitchen FINASTERIDE PO Take by mouth daily.      Marland Kitchen glimepiride (AMARYL) 4 MG tablet Take 4 mg by mouth daily.        Marland Kitchen lisinopril-hydrochlorothiazide (PRINZIDE,ZESTORETIC) 20-12.5 MG per tablet Take 1 tablet by mouth daily.  90 tablet  3  . metFORMIN (GLUCOPHAGE) 500 MG tablet Take 500 mg by mouth 2 (two) times daily.        . multivitamin (THERAGRAN) per tablet Take 1 tablet by mouth daily.        . nitroGLYCERIN (NITROSTAT) 0.4 MG SL tablet Place 0.4 mg under the tongue every 5 (five) minutes as needed.        . simvastatin (ZOCOR) 40 MG tablet TAKE ONE TABLET (40MG  TOTAL) BY MOUTH AT BEDTIME  30 tablet  6  . vitamin C (ASCORBIC ACID) 500 MG tablet Take 500 mg by mouth daily.         No current facility-administered medications for this visit.    Past Medical History  Diagnosis Date  . Coronary atherosclerosis of native coronary artery     2007 catheterization D1 70%, ramus intermediate 40%, large RV branch off the right coronary artery 60-70%.  . Acute myocardial infarction, unspecified site, episode of care unspecified   . Unspecified essential hypertension   . Other and unspecified hyperlipidemia   .  Type II or unspecified type diabetes mellitus without mention of complication, not stated as uncontrolled     type 2    Past Surgical History  Procedure Laterality Date  . Tonsillectomy and adenoidectomy      ROS:  As stated in the HPI and negative for all other systems.  PHYSICAL EXAM BP 125/79  Pulse 62  Ht 5\' 4"  (1.626 m)  Wt 166 lb 3.2 oz (75.388 kg)  BMI 28.51 kg/m2 GENERAL:  Well appearing HEENT:  Pupils equal round and reactive, fundi not visualized, oral mucosa unremarkable NECK:  No jugular venous distention, waveform within normal limits, carotid upstroke brisk and symmetric, no bruits, no thyromegaly LYMPHATICS:  No cervical, inguinal adenopathy LUNGS:  Clear to auscultation bilaterally BACK:  No CVA tenderness CHEST:  Unremarkable HEART:  PMI not displaced or sustained,S1 and S2 within normal limits, no S3, no S4, no clicks, no rubs, no murmurs ABD:  Flat, positive bowel sounds normal in frequency in pitch, no bruits, no rebound, no guarding, no midline pulsatile mass, no hepatomegaly, no splenomegaly EXT:  2 plus pulses throughout, no edema, no cyanosis no clubbing SKIN:  No rashes no nodules NEURO:  Cranial nerves II through XII grossly intact, motor  grossly intact throughout [facility]:  Cognitively intact, oriented to person place and time   EKG:  Sinus rhythm, rate 62, axis within normal limits, intervals within normal limits, no acute ST-T wave changes.  ASSESSMENT AND PLAN  CAD:    He has done well. It has been 7 years since his cardiac cath.  I will bring the patient back for a POET (Plain Old Exercise Test). This will allow me to screen for obstructive coronary disease, risk stratify and very importantly provide a prescription for exercise.  HYPERLIPIDEMIA:  I have asked him to get a copy of his lipid profile I would be happy to review this. He will continue current therapy.  DM:  He says this is well controlled per his primary provider.

## 2013-03-01 ENCOUNTER — Ambulatory Visit (INDEPENDENT_AMBULATORY_CARE_PROVIDER_SITE_OTHER): Payer: Medicare Other | Admitting: Physician Assistant

## 2013-03-01 DIAGNOSIS — R9439 Abnormal result of other cardiovascular function study: Secondary | ICD-10-CM

## 2013-03-01 DIAGNOSIS — I2510 Atherosclerotic heart disease of native coronary artery without angina pectoris: Secondary | ICD-10-CM

## 2013-03-01 NOTE — Patient Instructions (Signed)
SCHEDULE LEXISCAN

## 2013-03-01 NOTE — Progress Notes (Signed)
Exercise Treadmill Test  Pre-Exercise Testing Evaluation Rhythm: normal sinus  Rate: 65 bpm     Test  Exercise Tolerance Test Ordering MD: Angelina Sheriff, MD  Interpreting MD: Thomasenia Bottoms PA-C  Unique Test No: 1  Treadmill:  1  Indication for ETT: known ASHD  Contraindication to ETT: No   Stress Modality: exercise - treadmill  Cardiac Imaging Performed: non   Protocol: standard Bruce - maximal  Max BP:  190/81  Max MPHR (bpm):  155 85% MPR (bpm):  132  MPHR obtained (bpm):  142 % MPHR obtained:  91  Reached 85% MPHR (min:sec):  8:35 Total Exercise Time (min-sec):  9:18  Workload in METS:  10.5 Borg Scale: 15  Reason ETT Terminated:  patient's desire to stop    ST Segment Analysis At Rest: normal ST segments - no evidence of significant ST depression With Exercise: borderline ST changes  Other Information Arrhythmia:  No Angina during ETT:  absent (0) Quality of ETT:  indeterminate  ETT Interpretation:  borderline (indeterminate) with non-specific ST changes  Comments: Good exercise capacity. No chest pain. Normal BP response to exercise. There was borderline ST depression at peak exercise.  Cannot rule out ischemia. Good HR recovery in 1st minute post exercise at 2 mph on 2% grade.   Recommendations: Schedule Lexiscan Myoview. Signed, Tereso Newcomer, PA-C   03/01/2013 11:37 AM

## 2013-03-11 ENCOUNTER — Encounter: Payer: Self-pay | Admitting: Cardiology

## 2013-03-16 ENCOUNTER — Ambulatory Visit (HOSPITAL_COMMUNITY): Payer: Medicare Other | Attending: Physician Assistant | Admitting: Radiology

## 2013-03-16 VITALS — BP 108/63 | HR 60 | Ht 64.0 in | Wt 160.0 lb

## 2013-03-16 DIAGNOSIS — R9439 Abnormal result of other cardiovascular function study: Secondary | ICD-10-CM | POA: Insufficient documentation

## 2013-03-16 DIAGNOSIS — E785 Hyperlipidemia, unspecified: Secondary | ICD-10-CM | POA: Insufficient documentation

## 2013-03-16 DIAGNOSIS — E119 Type 2 diabetes mellitus without complications: Secondary | ICD-10-CM | POA: Insufficient documentation

## 2013-03-16 DIAGNOSIS — I10 Essential (primary) hypertension: Secondary | ICD-10-CM | POA: Insufficient documentation

## 2013-03-16 DIAGNOSIS — I252 Old myocardial infarction: Secondary | ICD-10-CM | POA: Insufficient documentation

## 2013-03-16 DIAGNOSIS — Z87891 Personal history of nicotine dependence: Secondary | ICD-10-CM | POA: Insufficient documentation

## 2013-03-16 DIAGNOSIS — Z9861 Coronary angioplasty status: Secondary | ICD-10-CM | POA: Insufficient documentation

## 2013-03-16 DIAGNOSIS — Z8249 Family history of ischemic heart disease and other diseases of the circulatory system: Secondary | ICD-10-CM | POA: Insufficient documentation

## 2013-03-16 DIAGNOSIS — I2510 Atherosclerotic heart disease of native coronary artery without angina pectoris: Secondary | ICD-10-CM | POA: Insufficient documentation

## 2013-03-16 MED ORDER — TECHNETIUM TC 99M SESTAMIBI GENERIC - CARDIOLITE
11.0000 | Freq: Once | INTRAVENOUS | Status: AC | PRN
  Administered 2013-03-16: 11 via INTRAVENOUS

## 2013-03-16 MED ORDER — REGADENOSON 0.4 MG/5ML IV SOLN
0.4000 mg | Freq: Once | INTRAVENOUS | Status: AC
  Administered 2013-03-16: 0.4 mg via INTRAVENOUS

## 2013-03-16 MED ORDER — TECHNETIUM TC 99M SESTAMIBI GENERIC - CARDIOLITE
33.0000 | Freq: Once | INTRAVENOUS | Status: AC | PRN
  Administered 2013-03-16: 33 via INTRAVENOUS

## 2013-03-16 NOTE — Progress Notes (Signed)
[facility] SITE 3 NUCLEAR MED [address] [place], [place] [address] [phone]    Cardiology Nuclear Med Study  Lance Riddle is a 67 y.o. male     MRN : [id number]     DOB: August 29, 1945  Procedure Date: 03/16/2013  Nuclear Med Background Indication for Stress Test:  Evaluation for Ischemia, PTCA Patency,  and 03-01-13 Abnormal GXT: borderline ST changes with exercise  History:  CAD, MI 1994, MPI 11/09 no ischemia EF 57% Cardiac Risk Factors: Family History - CAD, History of Smoking, Hypertension, Lipids and NIDDM  Symptoms:  non indicated   Nuclear Pre-Procedure Caffeine/Decaff Intake:  None > 12 hrs NPO After: 7:00pm   Lungs:  clear O2 Sat: 94% on room air. IV 0.9% NS with Angio Cath:  22g  IV Site: R Antecubital x 1, tolerated well IV Started by:  Irean Hong, RN  Chest Size (in):  40 Cup Size: n/a  Height: 5\' 4"  (1.626 m)  Weight:  160 lb (72.576 kg)  BMI:  Body mass index is 27.45 kg/(m^2). Tech Comments:  Held Glimepiride and metformin this am    Nuclear Med Study 1 or 2 day study: 1 day  Stress Test Type:  Treadmill/Lexiscan  Reading MD: Cassell Clement, MD  Order Authorizing Provider:  Rollene Rotunda, MD, and Tereso Newcomer, [facility]  Resting Radionuclide: Technetium 57m Sestamibi  Resting Radionuclide Dose: 11.0 mCi   Stress Radionuclide:  Technetium 40m Sestamibi  Stress Radionuclide Dose: 33.0 mCi           Stress Protocol Rest HR: 60 Stress HR: 92  Rest BP: 108/63 Stress BP: 114/52  Exercise Time (min): n/a METS: n/a   Predicted Max HR: 155 bpm % Max HR: 59.35 bpm Rate Pressure Product: 01027   Dose of Adenosine (mg):  n/a Dose of Lexiscan: 0.4 mg  Dose of Atropine (mg): n/a Dose of Dobutamine: n/a mcg/kg/min (at max HR)  Stress Test Technologist: Nelson Chimes, BS-ES  Nuclear Technologist:  Doyne Keel, CNMT     Rest Procedure:  Myocardial perfusion imaging was performed at rest 45 minutes following the intravenous administration of  Technetium 29m Sestamibi. Rest ECG: NSR - Normal EKG  Stress Procedure:  The patient received IV Lexiscan 0.4 mg over 15-seconds with concurrent low level exercise and then Technetium 27m Sestamibi was injected at 30-seconds while the patient continued walking one more minute.  Quantitative spect images were obtained after a 45-minute delay. During the infusion of Lexiscan, patient complained of stomach upset.  Symptoms began to resolve in recovery.   Stress ECG: Develops nonspecific T wave inversions in anterior leads with exercise. No ST depression.  QPS Raw Data Images:  Normal; no motion artifact; normal heart/lung ratio. Stress Images:  Normal homogeneous uptake in all areas of the myocardium. Rest Images:  Normal homogeneous uptake in all areas of the myocardium. Subtraction (SDS):  No evidence of ischemia. Transient Ischemic Dilatation (Normal <1.22):  0.99 Lung/Heart Ratio (Normal <0.45):  0.30  Quantitative Gated Spect Images QGS EDV:  85 ml QGS ESV:  35 ml  Impression Exercise Capacity:  Lexiscan with low level exercise. BP Response:  Normal blood pressure response. Clinical Symptoms:  No chest pain. ECG Impression:  No significant ST segment change with lexiscan. Comparison with Prior Nuclear Study: No significant change from previous study  Overall Impression:  Normal stress nuclear study. No evidence of ischemia by perfusion. No ischemic ST segment changes with stress.  Develops nonspecific T wave inversions in anterior  leads with exercise. No chest pain.  LV Ejection Fraction: 58%.  LV Wall Motion:  NL LV Function; NL Wall Motion  [employer]

## 2013-03-17 ENCOUNTER — Encounter: Payer: Self-pay | Admitting: Physician Assistant

## 2013-03-21 ENCOUNTER — Telehealth: Payer: Self-pay | Admitting: *Deleted

## 2013-03-21 NOTE — Telephone Encounter (Signed)
s/w pt's wife who states someone already called Friday with the results. I apologized for my call. I did not see a note that pt was already called.

## 2014-06-14 DIAGNOSIS — E119 Type 2 diabetes mellitus without complications: Secondary | ICD-10-CM | POA: Diagnosis not present

## 2014-06-14 DIAGNOSIS — Z6829 Body mass index (BMI) 29.0-29.9, adult: Secondary | ICD-10-CM | POA: Diagnosis not present

## 2014-06-14 DIAGNOSIS — E785 Hyperlipidemia, unspecified: Secondary | ICD-10-CM | POA: Diagnosis not present

## 2014-06-14 DIAGNOSIS — N401 Enlarged prostate with lower urinary tract symptoms: Secondary | ICD-10-CM | POA: Diagnosis not present

## 2014-06-14 DIAGNOSIS — I2510 Atherosclerotic heart disease of native coronary artery without angina pectoris: Secondary | ICD-10-CM | POA: Diagnosis not present

## 2014-06-14 DIAGNOSIS — I10 Essential (primary) hypertension: Secondary | ICD-10-CM | POA: Diagnosis not present

## 2014-07-03 DIAGNOSIS — K219 Gastro-esophageal reflux disease without esophagitis: Secondary | ICD-10-CM | POA: Diagnosis not present

## 2014-07-10 DIAGNOSIS — E119 Type 2 diabetes mellitus without complications: Secondary | ICD-10-CM | POA: Diagnosis not present

## 2014-07-14 DIAGNOSIS — E119 Type 2 diabetes mellitus without complications: Secondary | ICD-10-CM | POA: Diagnosis not present

## 2014-07-14 DIAGNOSIS — Z7982 Long term (current) use of aspirin: Secondary | ICD-10-CM | POA: Diagnosis not present

## 2014-07-14 DIAGNOSIS — I10 Essential (primary) hypertension: Secondary | ICD-10-CM | POA: Diagnosis not present

## 2014-07-14 DIAGNOSIS — K5900 Constipation, unspecified: Secondary | ICD-10-CM | POA: Diagnosis not present

## 2014-07-14 DIAGNOSIS — R1310 Dysphagia, unspecified: Secondary | ICD-10-CM | POA: Diagnosis not present

## 2014-07-14 DIAGNOSIS — B9681 Helicobacter pylori [H. pylori] as the cause of diseases classified elsewhere: Secondary | ICD-10-CM | POA: Diagnosis not present

## 2014-07-14 DIAGNOSIS — Z79899 Other long term (current) drug therapy: Secondary | ICD-10-CM | POA: Diagnosis not present

## 2014-07-14 DIAGNOSIS — K222 Esophageal obstruction: Secondary | ICD-10-CM | POA: Diagnosis not present

## 2014-07-14 DIAGNOSIS — K2960 Other gastritis without bleeding: Secondary | ICD-10-CM | POA: Diagnosis not present

## 2014-07-14 DIAGNOSIS — K257 Chronic gastric ulcer without hemorrhage or perforation: Secondary | ICD-10-CM | POA: Diagnosis not present

## 2014-07-14 DIAGNOSIS — K449 Diaphragmatic hernia without obstruction or gangrene: Secondary | ICD-10-CM | POA: Diagnosis not present

## 2014-07-14 DIAGNOSIS — K219 Gastro-esophageal reflux disease without esophagitis: Secondary | ICD-10-CM | POA: Diagnosis not present

## 2014-07-14 DIAGNOSIS — Z86010 Personal history of colonic polyps: Secondary | ICD-10-CM | POA: Diagnosis not present

## 2014-08-17 DIAGNOSIS — R1310 Dysphagia, unspecified: Secondary | ICD-10-CM | POA: Diagnosis not present

## 2014-10-10 DIAGNOSIS — E119 Type 2 diabetes mellitus without complications: Secondary | ICD-10-CM | POA: Diagnosis not present

## 2014-10-17 DIAGNOSIS — I10 Essential (primary) hypertension: Secondary | ICD-10-CM | POA: Diagnosis not present

## 2014-10-17 DIAGNOSIS — E1140 Type 2 diabetes mellitus with diabetic neuropathy, unspecified: Secondary | ICD-10-CM | POA: Diagnosis not present

## 2014-10-17 DIAGNOSIS — E1142 Type 2 diabetes mellitus with diabetic polyneuropathy: Secondary | ICD-10-CM | POA: Diagnosis not present

## 2014-10-17 DIAGNOSIS — E785 Hyperlipidemia, unspecified: Secondary | ICD-10-CM | POA: Diagnosis not present

## 2014-10-17 DIAGNOSIS — N401 Enlarged prostate with lower urinary tract symptoms: Secondary | ICD-10-CM | POA: Diagnosis not present

## 2014-11-08 DIAGNOSIS — E119 Type 2 diabetes mellitus without complications: Secondary | ICD-10-CM | POA: Diagnosis not present

## 2014-11-08 DIAGNOSIS — H2510 Age-related nuclear cataract, unspecified eye: Secondary | ICD-10-CM | POA: Diagnosis not present

## 2015-01-08 DIAGNOSIS — E119 Type 2 diabetes mellitus without complications: Secondary | ICD-10-CM | POA: Diagnosis not present

## 2015-02-23 DIAGNOSIS — I10 Essential (primary) hypertension: Secondary | ICD-10-CM | POA: Diagnosis not present

## 2015-02-23 DIAGNOSIS — N401 Enlarged prostate with lower urinary tract symptoms: Secondary | ICD-10-CM | POA: Diagnosis not present

## 2015-02-23 DIAGNOSIS — E1159 Type 2 diabetes mellitus with other circulatory complications: Secondary | ICD-10-CM | POA: Diagnosis not present

## 2015-02-23 DIAGNOSIS — I2510 Atherosclerotic heart disease of native coronary artery without angina pectoris: Secondary | ICD-10-CM | POA: Diagnosis not present

## 2015-02-23 DIAGNOSIS — E785 Hyperlipidemia, unspecified: Secondary | ICD-10-CM | POA: Diagnosis not present

## 2015-02-23 DIAGNOSIS — Z23 Encounter for immunization: Secondary | ICD-10-CM | POA: Diagnosis not present

## 2015-03-02 DIAGNOSIS — Z1211 Encounter for screening for malignant neoplasm of colon: Secondary | ICD-10-CM | POA: Diagnosis not present

## 2015-03-19 DIAGNOSIS — Z6829 Body mass index (BMI) 29.0-29.9, adult: Secondary | ICD-10-CM | POA: Diagnosis not present

## 2015-03-19 DIAGNOSIS — Z0000 Encounter for general adult medical examination without abnormal findings: Secondary | ICD-10-CM | POA: Diagnosis not present

## 2015-04-10 DIAGNOSIS — E119 Type 2 diabetes mellitus without complications: Secondary | ICD-10-CM | POA: Diagnosis not present

## 2015-06-29 DIAGNOSIS — I10 Essential (primary) hypertension: Secondary | ICD-10-CM | POA: Diagnosis not present

## 2015-06-29 DIAGNOSIS — E1159 Type 2 diabetes mellitus with other circulatory complications: Secondary | ICD-10-CM | POA: Diagnosis not present

## 2015-06-29 DIAGNOSIS — I2510 Atherosclerotic heart disease of native coronary artery without angina pectoris: Secondary | ICD-10-CM | POA: Diagnosis not present

## 2015-06-29 DIAGNOSIS — E785 Hyperlipidemia, unspecified: Secondary | ICD-10-CM | POA: Diagnosis not present

## 2015-06-29 DIAGNOSIS — E1142 Type 2 diabetes mellitus with diabetic polyneuropathy: Secondary | ICD-10-CM | POA: Diagnosis not present

## 2015-06-29 DIAGNOSIS — R339 Retention of urine, unspecified: Secondary | ICD-10-CM | POA: Diagnosis not present

## 2015-07-10 DIAGNOSIS — E119 Type 2 diabetes mellitus without complications: Secondary | ICD-10-CM | POA: Diagnosis not present

## 2015-08-17 DIAGNOSIS — S61411A Laceration without foreign body of right hand, initial encounter: Secondary | ICD-10-CM | POA: Diagnosis not present

## 2015-10-09 ENCOUNTER — Encounter: Payer: Self-pay | Admitting: Cardiovascular Disease

## 2015-10-09 DIAGNOSIS — E119 Type 2 diabetes mellitus without complications: Secondary | ICD-10-CM | POA: Diagnosis not present

## 2015-10-10 DIAGNOSIS — H2510 Age-related nuclear cataract, unspecified eye: Secondary | ICD-10-CM | POA: Diagnosis not present

## 2015-10-10 DIAGNOSIS — Z7984 Long term (current) use of oral hypoglycemic drugs: Secondary | ICD-10-CM | POA: Diagnosis not present

## 2015-10-10 DIAGNOSIS — E119 Type 2 diabetes mellitus without complications: Secondary | ICD-10-CM | POA: Diagnosis not present

## 2015-10-18 ENCOUNTER — Ambulatory Visit: Payer: Self-pay | Admitting: Cardiovascular Disease

## 2015-10-19 ENCOUNTER — Encounter: Payer: Self-pay | Admitting: Cardiovascular Disease

## 2015-11-01 DIAGNOSIS — I10 Essential (primary) hypertension: Secondary | ICD-10-CM | POA: Diagnosis not present

## 2015-11-01 DIAGNOSIS — E1151 Type 2 diabetes mellitus with diabetic peripheral angiopathy without gangrene: Secondary | ICD-10-CM | POA: Diagnosis not present

## 2015-11-01 DIAGNOSIS — E1159 Type 2 diabetes mellitus with other circulatory complications: Secondary | ICD-10-CM | POA: Diagnosis not present

## 2015-11-01 DIAGNOSIS — E785 Hyperlipidemia, unspecified: Secondary | ICD-10-CM | POA: Diagnosis not present

## 2015-11-01 DIAGNOSIS — M4010 Other secondary kyphosis, site unspecified: Secondary | ICD-10-CM | POA: Diagnosis not present

## 2015-11-01 DIAGNOSIS — I2510 Atherosclerotic heart disease of native coronary artery without angina pectoris: Secondary | ICD-10-CM | POA: Diagnosis not present

## 2015-11-26 ENCOUNTER — Ambulatory Visit (INDEPENDENT_AMBULATORY_CARE_PROVIDER_SITE_OTHER): Payer: Medicare Other | Admitting: Cardiovascular Disease

## 2015-11-26 ENCOUNTER — Encounter: Payer: Self-pay | Admitting: Cardiovascular Disease

## 2015-11-26 VITALS — BP 114/70 | HR 67 | Ht 64.0 in | Wt 174.4 lb

## 2015-11-26 DIAGNOSIS — I25810 Atherosclerosis of coronary artery bypass graft(s) without angina pectoris: Secondary | ICD-10-CM

## 2015-11-26 DIAGNOSIS — E785 Hyperlipidemia, unspecified: Secondary | ICD-10-CM | POA: Diagnosis not present

## 2015-11-26 NOTE — Patient Instructions (Signed)
Medication Instructions:  Your physician recommends that you continue on your current medications as directed. Please refer to the Current Medication list given to you today.   Labwork: TODAY - cholesterol, complete metabolic panel   Testing/Procedures: None Ordered   Follow-Up: Your physician wants you to follow-up in: 1 year with Dr. Nahser.  You will receive a reminder letter in the mail two months in advance. If you don't receive a letter, please call our office to schedule the follow-up appointment.   If you need a refill on your cardiac medications before your next appointment, please call your pharmacy.   Thank you for choosing [facility]! Egypt Marchiano, RN [phone]    

## 2015-11-26 NOTE — Progress Notes (Signed)
Cardiology Office Note   Date:  11/26/2015   ID:  Lance Riddle, DOB [birth year], MRN [id number]  PCP:  Lillard Anes, MD  Cardiologist:   Mertie Moores, MD  ( previoiusly Hochrein)   Chief Complaint  Patient presents with  . Coronary Artery Disease   Problem list 1. Coronary artery disease 2. Hypertension 3. Hyperlipidemia   History of Present Illness: Lance Riddle is a 70 y.o. male who presents for his CAD Previous patient of Hochrein.    Has been having some sharp pain .   Not related to exercise.  The pains are not related to eating or drinking, twisting or turning. Not related to taking a deep breath. Last a split second Walks 2 miles every am - no CP with this. Does have some leg cramps  Also has leg cramps at night .     Past Medical History  Diagnosis Date  . Coronary atherosclerosis of native coronary artery     2007 catheterization D1 70%, ramus intermediate 40%, large RV branch off the right coronary artery 60-70%.; Lexiscan Myoview (03/2013):  No ischemia, EF 58% (normal study).  . Acute myocardial infarction, unspecified site, episode of care unspecified   . Unspecified essential hypertension   . Other and unspecified hyperlipidemia   . Type II or unspecified type diabetes mellitus without mention of complication, not stated as uncontrolled     type 2    Past Surgical History  Procedure Laterality Date  . Tonsillectomy and adenoidectomy       Current Outpatient Prescriptions  Medication Sig Dispense Refill  . amLODipine (NORVASC) 10 MG tablet Take 1 tablet (10 mg total) by mouth daily. 90 tablet 3  . aspirin 81 MG tablet Take 81 mg by mouth daily.      . finasteride (PROSCAR) 5 MG tablet Take 5 mg by mouth daily.    Marland Kitchen FINASTERIDE PO Take by mouth daily.    Marland Kitchen glimepiride (AMARYL) 4 MG tablet Take 4 mg by mouth daily.      Marland Kitchen lisinopril-hydrochlorothiazide (PRINZIDE,ZESTORETIC) 20-12.5 MG per tablet Take 1 tablet by mouth daily. 90 tablet 3   . metFORMIN (GLUCOPHAGE) 500 MG tablet Take 500 mg by mouth 2 (two) times daily.      . multivitamin (THERAGRAN) per tablet Take 1 tablet by mouth daily.      . nitroGLYCERIN (NITROSTAT) 0.4 MG SL tablet Place 0.4 mg under the tongue every 5 (five) minutes as needed.      . simvastatin (ZOCOR) 40 MG tablet TAKE ONE TABLET (40MG  TOTAL) BY MOUTH AT BEDTIME 30 tablet 6  . tamsulosin (FLOMAX) 0.4 MG CAPS capsule Take 0.4 mg by mouth daily.    . vitamin C (ASCORBIC ACID) 500 MG tablet Take 500 mg by mouth daily.       No current facility-administered medications for this visit.    Allergies:   Review of patient's allergies indicates no known allergies.    Social History:  The patient  reports that he has quit smoking. His smokeless tobacco use includes Chew.   Family History:  The patient's family history includes Cancer in his brother; Coronary artery disease in his other; Heart attack in his mother; Lung cancer in his father.    ROS:  Please see the history of present illness.    Review of Systems: Constitutional:  denies fever, chills, diaphoresis, appetite change and fatigue.  HEENT: denies photophobia, eye pain, redness, hearing loss, ear pain, congestion, sore throat, rhinorrhea, sneezing,  neck pain, neck stiffness and tinnitus.  Respiratory: denies SOB, DOE, cough, chest tightness, and wheezing.  Cardiovascular: admits to chest pain,   Denies palpitations and leg swelling.  Gastrointestinal: denies nausea, vomiting, abdominal pain, diarrhea, constipation, blood in stool.  Genitourinary: denies dysuria, urgency, frequency, hematuria, flank pain and difficulty urinating.  Musculoskeletal: denies  myalgias, back pain, joint swelling, arthralgias and gait problem.   Skin: denies pallor, rash and wound.  Neurological: denies dizziness, seizures, syncope, weakness, light-headedness, numbness and headaches.   Hematological: denies adenopathy, easy bruising, personal or family bleeding  history.  Psychiatric/ Behavioral: denies suicidal ideation, mood changes, confusion, nervousness, sleep disturbance and agitation.       All other systems are reviewed and negative.    PHYSICAL EXAM: VS:  BP 114/70 mmHg  Pulse 67  Ht 5\' 4"  (1.626 m)  Wt 174 lb 6.4 oz (79.107 kg)  BMI 29.92 kg/m2 , BMI Body mass index is 29.92 kg/(m^2). GEN: Well nourished, well developed, in no acute distress HEENT: normal Neck: no JVD, carotid bruits, or masses Cardiac: RRR; no murmurs, rubs, or gallops,no edema  Respiratory:  clear to auscultation bilaterally, normal work of breathing GI: soft, nontender, nondistended, + BS MS: no deformity or atrophy Skin: warm and dry, no rash Neuro:  Strength and sensation are intact Psych: normal   EKG:  EKG is ordered today. The ekg ordered today demonstrates  NSR at 67.   Normal ECG    Recent Labs: No results found for requested labs within last 365 days.    Lipid Panel No results found for: CHOL, TRIG, HDL, CHOLHDL, VLDL, LDLCALC, LDLDIRECT    Wt Readings from Last 3 Encounters:  11/26/15 174 lb 6.4 oz (79.107 kg)  03/16/13 160 lb (72.576 kg)  02/08/13 166 lb 3.2 oz (75.388 kg)      Other studies Reviewed: Additional studies/ records that were reviewed today include: . Review of the above records demonstrates:    ASSESSMENT AND PLAN:  1.  CAD :   He has moderate coronary artery disease. There were no obstructive lesions. He's not having any episodes of angina. He has occasional episodes of sharp chest pain but these are very atypical for cardiac angina.  We will continue with the same medications.  I will see him in 1 year.    2. Essential hypertension: Continue current medications.  3. Hyperlipidemia: Continue current medications.  4. Leg cramps: His distal leg pulses are 2+. I do not think that his symptoms are due to peripheral arterial disease. They typically occur when walking. He may be having leg cramps. I have  encouraged him to stay hydrated. I've encouraged him to increase his potassium intake. He'll check for further recommendations with his medical doctor.    Current medicines are reviewed at length with the patient today.  The patient does not have concerns regarding medicines.  Labs/ tests ordered today include:  No orders of the defined types were placed in this encounter.     Mertie Moores, MD  11/26/2015 2:56 PM    [facility] [place], [place], [place]  [address] Phone: [phone]; Fax: [phone]   [facility]  [address] [place], [place]  [address] [phone]   Fax [phone]

## 2015-11-27 LAB — LIPID PANEL
CHOL/HDL RATIO: 1.8 ratio (ref <=5.0)
Cholesterol: 125 mg/dL (ref 125–200)
HDL: 68 mg/dL (ref >=40)
LDL Cholesterol: 45 mg/dL (ref <130)
Triglycerides: 59 mg/dL (ref <150)
VLDL: 12 mg/dL (ref <30)

## 2015-11-27 LAB — COMPREHENSIVE METABOLIC PANEL
ALK PHOS: 40 U/L (ref 40–115)
ALT: 10 U/L (ref 9–46)
AST: 10 U/L (ref 10–35)
Albumin: 4.5 g/dL (ref 3.6–5.1)
BUN: 16 mg/dL (ref 7–25)
CO2: 27 mmol/L (ref 20–31)
Calcium: 9.4 mg/dL (ref 8.6–10.3)
Chloride: 100 mmol/L (ref 98–110)
Creat: 0.86 mg/dL (ref 0.70–1.25)
GLUCOSE: 82 mg/dL (ref 65–99)
POTASSIUM: 4.2 mmol/L (ref 3.5–5.3)
Sodium: 137 mmol/L (ref 135–146)
Total Bilirubin: 0.4 mg/dL (ref 0.2–1.2)
Total Protein: 7.2 g/dL (ref 6.1–8.1)

## 2016-01-10 DIAGNOSIS — E119 Type 2 diabetes mellitus without complications: Secondary | ICD-10-CM | POA: Diagnosis not present

## 2016-02-07 DIAGNOSIS — Z23 Encounter for immunization: Secondary | ICD-10-CM | POA: Diagnosis not present

## 2016-03-14 DIAGNOSIS — I10 Essential (primary) hypertension: Secondary | ICD-10-CM | POA: Diagnosis not present

## 2016-03-14 DIAGNOSIS — E785 Hyperlipidemia, unspecified: Secondary | ICD-10-CM | POA: Diagnosis not present

## 2016-03-14 DIAGNOSIS — E1140 Type 2 diabetes mellitus with diabetic neuropathy, unspecified: Secondary | ICD-10-CM | POA: Diagnosis not present

## 2016-03-14 DIAGNOSIS — E1159 Type 2 diabetes mellitus with other circulatory complications: Secondary | ICD-10-CM | POA: Diagnosis not present

## 2016-03-14 DIAGNOSIS — E1351 Other specified diabetes mellitus with diabetic peripheral angiopathy without gangrene: Secondary | ICD-10-CM | POA: Diagnosis not present

## 2016-03-14 DIAGNOSIS — E1142 Type 2 diabetes mellitus with diabetic polyneuropathy: Secondary | ICD-10-CM | POA: Diagnosis not present

## 2016-04-10 DIAGNOSIS — E119 Type 2 diabetes mellitus without complications: Secondary | ICD-10-CM | POA: Diagnosis not present

## 2016-10-02 DIAGNOSIS — Z7984 Long term (current) use of oral hypoglycemic drugs: Secondary | ICD-10-CM | POA: Diagnosis not present

## 2016-10-02 DIAGNOSIS — E119 Type 2 diabetes mellitus without complications: Secondary | ICD-10-CM | POA: Diagnosis not present

## 2016-10-02 DIAGNOSIS — H2510 Age-related nuclear cataract, unspecified eye: Secondary | ICD-10-CM | POA: Diagnosis not present

## 2016-10-09 DIAGNOSIS — E119 Type 2 diabetes mellitus without complications: Secondary | ICD-10-CM | POA: Diagnosis not present

## 2016-10-10 DIAGNOSIS — E119 Type 2 diabetes mellitus without complications: Secondary | ICD-10-CM | POA: Diagnosis not present

## 2016-12-05 DIAGNOSIS — I10 Essential (primary) hypertension: Secondary | ICD-10-CM | POA: Diagnosis not present

## 2016-12-05 DIAGNOSIS — N401 Enlarged prostate with lower urinary tract symptoms: Secondary | ICD-10-CM | POA: Diagnosis not present

## 2016-12-05 DIAGNOSIS — E782 Mixed hyperlipidemia: Secondary | ICD-10-CM | POA: Diagnosis not present

## 2016-12-05 DIAGNOSIS — I2510 Atherosclerotic heart disease of native coronary artery without angina pectoris: Secondary | ICD-10-CM | POA: Diagnosis not present

## 2016-12-05 DIAGNOSIS — E1165 Type 2 diabetes mellitus with hyperglycemia: Secondary | ICD-10-CM | POA: Diagnosis not present

## 2016-12-29 ENCOUNTER — Encounter: Payer: Self-pay | Admitting: Cardiovascular Disease

## 2016-12-29 ENCOUNTER — Encounter (INDEPENDENT_AMBULATORY_CARE_PROVIDER_SITE_OTHER): Payer: Self-pay

## 2016-12-29 ENCOUNTER — Ambulatory Visit (INDEPENDENT_AMBULATORY_CARE_PROVIDER_SITE_OTHER): Payer: Medicare Other | Admitting: Cardiovascular Disease

## 2016-12-29 VITALS — BP 108/64 | HR 68 | Ht 64.0 in | Wt 172.8 lb

## 2016-12-29 DIAGNOSIS — I10 Essential (primary) hypertension: Secondary | ICD-10-CM | POA: Diagnosis not present

## 2016-12-29 DIAGNOSIS — E782 Mixed hyperlipidemia: Secondary | ICD-10-CM | POA: Diagnosis not present

## 2016-12-29 DIAGNOSIS — I2510 Atherosclerotic heart disease of native coronary artery without angina pectoris: Secondary | ICD-10-CM | POA: Diagnosis not present

## 2016-12-29 NOTE — Patient Instructions (Signed)
Medication Instructions:  Your physician recommends that you continue on your current medications as directed. Please refer to the Current Medication list given to you today.   Labwork: None Ordered   Testing/Procedures: None Ordered   Follow-Up: Your physician wants you to follow-up in: 1 year with Dr. Nahser.  You will receive a reminder letter in the mail two months in advance. If you don't receive a letter, please call our office to schedule the follow-up appointment.   If you need a refill on your cardiac medications before your next appointment, please call your pharmacy.   Thank you for choosing [facility]! Mykale Gandolfo, RN [phone]    

## 2016-12-29 NOTE — Progress Notes (Signed)
Cardiology Office Note   Date:  12/29/2016   ID:  Lance Riddle, Lance Riddle September 03, 1945, MRN [id number]  PCP:  Lance Anes, MD  Cardiologist:   Mertie Moores, MD  ( previoiusly Hochrein)   Chief Complaint  Patient presents with  . Follow-up    hyperlipidemia    Problem list 1. Coronary artery disease 2. Hypertension 3. Hyperlipidemia   History of Present Illness: Lance Riddle is a 71 y.o. male who presents for his CAD by cath in 1994.  Previous patient of Hochrein.    Has been having some sharp pain .   Not related to exercise.  The pains are not related to eating or drinking, twisting or turning. Not related to taking a deep breath. Last a split second Walks 2 miles every am - no CP with this. Does have some leg cramps  Also has leg cramps at night .   Aug. 20, 2018:  Lance Riddle, Lance Riddle, Lance his wife this past June.   Lance 46 years.  Has been active.  Works in the garden, [place] the yard regularly .    No CP or dyspnea.   Labs from primary MD shows : Chol = 136 Trigs  = 68 HDL = 65 LDL = 57   Past Medical History:  Diagnosis Date  . Acute myocardial infarction, unspecified site, episode of care unspecified   . Coronary atherosclerosis of native coronary artery    2007 catheterization D1 70%, ramus intermediate 40%, large RV branch off the right coronary artery 60-70%.; Lexiscan Myoview (03/2013):  No ischemia, EF 58% (normal study).  . Other and unspecified hyperlipidemia   . Type II or unspecified type diabetes mellitus without mention of complication, not stated as uncontrolled    type 2  . Unspecified essential hypertension     Past Surgical History:  Procedure Laterality Date  . TONSILLECTOMY AND ADENOIDECTOMY       Current Outpatient Prescriptions  Medication Sig Dispense Refill  . amLODipine (NORVASC) 10 MG tablet Take 1 tablet (10 mg total) by mouth daily. 90 tablet 3  . aspirin 81 MG tablet  Take 81 mg by mouth daily.      . finasteride (PROSCAR) 5 MG tablet Take 5 mg by mouth daily.    Marland Kitchen glimepiride (AMARYL) 4 MG tablet Take 4 mg by mouth daily.      Marland Kitchen lisinopril-hydrochlorothiazide (PRINZIDE,ZESTORETIC) 20-12.5 MG per tablet Take 1 tablet by mouth daily. 90 tablet 3  . metFORMIN (GLUCOPHAGE) 500 MG tablet Take 500 mg by mouth 2 (two) times daily.      . multivitamin (THERAGRAN) per tablet Take 1 tablet by mouth daily.      . nitroGLYCERIN (NITROSTAT) 0.4 MG SL tablet Place 0.4 mg under the tongue every 5 (five) minutes as needed for chest pain.     Marland Kitchen omeprazole (PRILOSEC) 20 MG capsule Take 20 mg by mouth daily.    . simvastatin (ZOCOR) 40 MG tablet TAKE ONE TABLET (40MG  TOTAL) BY MOUTH AT BEDTIME 30 tablet 6  . tamsulosin (FLOMAX) 0.4 MG CAPS capsule Take 0.4 mg by mouth daily.    . vitamin C (ASCORBIC ACID) 500 MG tablet Take 500 mg by mouth daily.       No current facility-administered medications for this visit.     Allergies:   Patient has no known allergies.    Social History:  The patient  reports that he has  quit smoking. His smokeless tobacco use includes Chew.   Family History:  The patient's family history includes Cancer in his brother; Coronary artery disease in his other; Heart attack in his mother; Lung cancer in his father.    ROS:  Please see the history of present illness.    Review of Systems: Constitutional:  denies fever, chills, diaphoresis, appetite change and fatigue.  HEENT: denies photophobia, eye pain, redness, hearing loss, ear pain, congestion, sore throat, rhinorrhea, sneezing, neck pain, neck stiffness and tinnitus.  Respiratory: denies SOB, DOE, cough, chest tightness, and wheezing.  Cardiovascular: admits to chest pain,   Denies palpitations and leg swelling.  Gastrointestinal: denies nausea, vomiting, abdominal pain, diarrhea, constipation, blood in stool.  Genitourinary: denies dysuria, urgency, frequency, hematuria, flank pain and  difficulty urinating.  Musculoskeletal: denies  myalgias, back pain, joint swelling, arthralgias and gait problem.   Skin: denies pallor, rash and wound.  Neurological: denies dizziness, seizures, syncope, weakness, light-headedness, numbness and headaches.   Hematological: denies adenopathy, easy bruising, personal or family bleeding history.  Psychiatric/ Behavioral: denies suicidal ideation, mood changes, confusion, nervousness, sleep disturbance and agitation.       All other systems are reviewed and negative.    PHYSICAL EXAM: VS:  BP 108/64   Pulse 68   Ht 5\' 4"  (1.626 m)   Wt 172 lb 12.8 oz (78.4 kg)   BMI 29.66 kg/m  , BMI Body mass index is 29.66 kg/m. GEN: Well nourished, well developed, in no acute distress  HEENT: normal  Neck: no JVD, carotid bruits, or masses Cardiac: RRR; no murmurs, rubs, or gallops,no edema  Respiratory:  clear to auscultation bilaterally, normal work of breathing GI: soft, nontender, nondistended, + BS MS: no deformity or atrophy  Skin: warm and dry, no rash Neuro:  Strength and sensation are intact Psych: normal   EKG:  EKG is ordered today. The ekg ordered today demonstrates  NSR at 68.   Normal ECG    Recent Labs: No results found for requested labs within last 8760 hours.    Lipid Panel    Component Value Date/Time   CHOL 125 11/26/2015 1511   TRIG 59 11/26/2015 1511   HDL 68 11/26/2015 1511   CHOLHDL 1.8 11/26/2015 1511   VLDL 12 11/26/2015 1511   LDLCALC 45 11/26/2015 1511      Wt Readings from Last 3 Encounters:  12/29/16 172 lb 12.8 oz (78.4 kg)  11/26/15 174 lb 6.4 oz (79.1 kg)  03/16/13 160 lb (72.6 kg)      Other studies Reviewed: Additional studies/ records that were reviewed today include: . Review of the above records demonstrates:    ASSESSMENT AND PLAN:  1.  CAD :   He has moderate coronary artery disease. There were no obstructive lesions. He's not having any episodes of angina. He has occasional  episodes of sharp chest pain but these are very atypical for cardiac angina.  We will continue with the same medications.  I will see him in 1 Riddle.    2. Essential hypertension: Continue current medications.  3. Hyperlipidemia: Continue current medications.  4. Leg cramps: seem to be better.    Current medicines are reviewed at length with the patient today.  The patient does not have concerns regarding medicines.  Labs/ tests ordered today include:  No orders of the defined types were placed in this encounter.    Mertie Moores, MD  12/29/2016 11:24 AM    [facility]  [address], [place],   [address] Phone: [phone]; Fax: [phone]

## 2017-01-09 DIAGNOSIS — E119 Type 2 diabetes mellitus without complications: Secondary | ICD-10-CM | POA: Diagnosis not present

## 2017-04-09 DIAGNOSIS — E1151 Type 2 diabetes mellitus with diabetic peripheral angiopathy without gangrene: Secondary | ICD-10-CM | POA: Diagnosis not present

## 2017-04-09 DIAGNOSIS — E1165 Type 2 diabetes mellitus with hyperglycemia: Secondary | ICD-10-CM | POA: Diagnosis not present

## 2017-04-09 DIAGNOSIS — I10 Essential (primary) hypertension: Secondary | ICD-10-CM | POA: Diagnosis not present

## 2017-04-09 DIAGNOSIS — Z125 Encounter for screening for malignant neoplasm of prostate: Secondary | ICD-10-CM | POA: Diagnosis not present

## 2017-04-09 DIAGNOSIS — Z23 Encounter for immunization: Secondary | ICD-10-CM | POA: Diagnosis not present

## 2017-04-09 DIAGNOSIS — I2510 Atherosclerotic heart disease of native coronary artery without angina pectoris: Secondary | ICD-10-CM | POA: Diagnosis not present

## 2017-04-09 DIAGNOSIS — E782 Mixed hyperlipidemia: Secondary | ICD-10-CM | POA: Diagnosis not present

## 2017-04-16 DIAGNOSIS — E119 Type 2 diabetes mellitus without complications: Secondary | ICD-10-CM | POA: Diagnosis not present

## 2017-07-13 DIAGNOSIS — E119 Type 2 diabetes mellitus without complications: Secondary | ICD-10-CM | POA: Diagnosis not present

## 2017-08-11 DIAGNOSIS — E1165 Type 2 diabetes mellitus with hyperglycemia: Secondary | ICD-10-CM | POA: Diagnosis not present

## 2017-08-11 DIAGNOSIS — R7309 Other abnormal glucose: Secondary | ICD-10-CM | POA: Diagnosis not present

## 2017-08-11 DIAGNOSIS — E782 Mixed hyperlipidemia: Secondary | ICD-10-CM | POA: Diagnosis not present

## 2017-08-11 DIAGNOSIS — I10 Essential (primary) hypertension: Secondary | ICD-10-CM | POA: Diagnosis not present

## 2017-08-11 DIAGNOSIS — I2510 Atherosclerotic heart disease of native coronary artery without angina pectoris: Secondary | ICD-10-CM | POA: Diagnosis not present

## 2017-08-11 DIAGNOSIS — E1151 Type 2 diabetes mellitus with diabetic peripheral angiopathy without gangrene: Secondary | ICD-10-CM | POA: Diagnosis not present

## 2017-08-14 DIAGNOSIS — Z86010 Personal history of colonic polyps: Secondary | ICD-10-CM | POA: Diagnosis not present

## 2017-08-14 DIAGNOSIS — D123 Benign neoplasm of transverse colon: Secondary | ICD-10-CM | POA: Diagnosis not present

## 2017-08-14 DIAGNOSIS — K635 Polyp of colon: Secondary | ICD-10-CM | POA: Diagnosis not present

## 2017-08-14 DIAGNOSIS — K5730 Diverticulosis of large intestine without perforation or abscess without bleeding: Secondary | ICD-10-CM | POA: Diagnosis not present

## 2017-08-14 DIAGNOSIS — Z1211 Encounter for screening for malignant neoplasm of colon: Secondary | ICD-10-CM | POA: Diagnosis not present

## 2017-08-14 DIAGNOSIS — D125 Benign neoplasm of sigmoid colon: Secondary | ICD-10-CM | POA: Diagnosis not present

## 2017-08-14 LAB — HM COLONOSCOPY

## 2017-10-09 DIAGNOSIS — E119 Type 2 diabetes mellitus without complications: Secondary | ICD-10-CM | POA: Diagnosis not present

## 2017-12-01 DIAGNOSIS — E119 Type 2 diabetes mellitus without complications: Secondary | ICD-10-CM | POA: Diagnosis not present

## 2017-12-01 DIAGNOSIS — H2510 Age-related nuclear cataract, unspecified eye: Secondary | ICD-10-CM | POA: Diagnosis not present

## 2017-12-01 DIAGNOSIS — Z7984 Long term (current) use of oral hypoglycemic drugs: Secondary | ICD-10-CM | POA: Diagnosis not present

## 2017-12-11 DIAGNOSIS — N401 Enlarged prostate with lower urinary tract symptoms: Secondary | ICD-10-CM | POA: Diagnosis not present

## 2017-12-11 DIAGNOSIS — E1169 Type 2 diabetes mellitus with other specified complication: Secondary | ICD-10-CM | POA: Diagnosis not present

## 2017-12-11 DIAGNOSIS — I10 Essential (primary) hypertension: Secondary | ICD-10-CM | POA: Diagnosis not present

## 2017-12-11 DIAGNOSIS — K210 Gastro-esophageal reflux disease with esophagitis: Secondary | ICD-10-CM | POA: Diagnosis not present

## 2017-12-11 DIAGNOSIS — E782 Mixed hyperlipidemia: Secondary | ICD-10-CM | POA: Diagnosis not present

## 2018-01-04 ENCOUNTER — Encounter: Payer: Self-pay | Admitting: Cardiovascular Disease

## 2018-01-04 ENCOUNTER — Ambulatory Visit: Payer: Medicare Other | Admitting: Cardiovascular Disease

## 2018-01-04 ENCOUNTER — Encounter (INDEPENDENT_AMBULATORY_CARE_PROVIDER_SITE_OTHER): Payer: Self-pay

## 2018-01-04 VITALS — BP 132/66 | HR 72 | Ht 64.0 in | Wt 172.1 lb

## 2018-01-04 DIAGNOSIS — I2510 Atherosclerotic heart disease of native coronary artery without angina pectoris: Secondary | ICD-10-CM | POA: Diagnosis not present

## 2018-01-04 DIAGNOSIS — E782 Mixed hyperlipidemia: Secondary | ICD-10-CM

## 2018-01-04 DIAGNOSIS — I10 Essential (primary) hypertension: Secondary | ICD-10-CM | POA: Diagnosis not present

## 2018-01-04 NOTE — Progress Notes (Signed)
Cardiology Office Note   Date:  01/04/2018   ID:  Riddle, Lance 10-06-1945, MRN [id number]  PCP:  Lillard Anes, MD  Cardiologist:   Mertie Moores, MD  ( previoiusly Hochrein)   Chief Complaint  Patient presents with  . Hyperlipidemia  . Hypertension   Problem list 1. Coronary artery disease 2. Hypertension 3. Hyperlipidemia     Lance Riddle is a 72 y.o. male who presents for his CAD by cath in 1994.  Previous patient of Hochrein.    Has been having some sharp pain .   Not related to exercise.  The pains are not related to eating or drinking, twisting or turning. Not related to taking a deep breath. Last a split second Walks 2 miles every am - no CP with this. Does have some leg cramps  Also has leg cramps at night .   Aug. 20, 2018:  Lance Riddle is a 72 yo with hx of mild, nonobstructive CAD. Rough year, lost his wife this past June.   Married 44 years.  Has been active.  Works in the garden, [place] the yard regularly .    No CP or dyspnea.   Labs from primary MD shows : Chol = 136 Trigs  = 68 HDL = 65 LDL = 57  January 04, 2018:  Lance Riddle is seen today for follow-up visit of his hypertension and hyperlipidemia.  He has a history of mild, nonobstructive coronary artery disease. Grew a garden again this year. Lots of veggies  Had labs with his primary MD earlier this month     Past Medical History:  Diagnosis Date  . Acute myocardial infarction, unspecified site, episode of care unspecified   . Coronary atherosclerosis of native coronary artery    2007 catheterization D1 70%, ramus intermediate 40%, large RV branch off the right coronary artery 60-70%.; Lexiscan Myoview (03/2013):  No ischemia, EF 58% (normal study).  . Other and unspecified hyperlipidemia   . Type II or unspecified type diabetes mellitus without mention of complication, not stated as uncontrolled    type 2  . Unspecified essential hypertension     Past Surgical History:  Procedure  Laterality Date  . TONSILLECTOMY AND ADENOIDECTOMY       Current Outpatient Medications  Medication Sig Dispense Refill  . amLODipine (NORVASC) 10 MG tablet Take 1 tablet (10 mg total) by mouth daily. 90 tablet 3  . aspirin 81 MG tablet Take 81 mg by mouth daily.      . finasteride (PROSCAR) 5 MG tablet Take 5 mg by mouth daily.    Marland Kitchen glimepiride (AMARYL) 4 MG tablet Take 4 mg by mouth daily.      Marland Kitchen lisinopril-hydrochlorothiazide (PRINZIDE,ZESTORETIC) 20-12.5 MG per tablet Take 1 tablet by mouth daily. 90 tablet 3  . metFORMIN (GLUCOPHAGE) 500 MG tablet Take 500 mg by mouth 2 (two) times daily.      . multivitamin (THERAGRAN) per tablet Take 1 tablet by mouth daily.      . nitroGLYCERIN (NITROSTAT) 0.4 MG SL tablet Place 0.4 mg under the tongue every 5 (five) minutes as needed for chest pain.     Marland Kitchen omeprazole (PRILOSEC) 20 MG capsule Take 20 mg by mouth daily.    . simvastatin (ZOCOR) 40 MG tablet TAKE ONE TABLET (40MG  TOTAL) BY MOUTH AT BEDTIME 30 tablet 6  . tamsulosin (FLOMAX) 0.4 MG CAPS capsule Take 0.4 mg by mouth daily.    . vitamin C (ASCORBIC ACID) 500 MG  tablet Take 500 mg by mouth daily.       No current facility-administered medications for this visit.     Allergies:   Patient has no known allergies.    Social History:  The patient  reports that he has quit smoking. His smokeless tobacco use includes chew.   Family History:  The patient's family history includes Cancer in his brother; Coronary artery disease in his other; Heart attack in his mother; Lung cancer in his father.    ROS: Noted in current history, otherwise review of systems is negative.  Physical Exam: Blood pressure 132/66, pulse 72, height 5\' 4"  (1.626 m), weight 172 lb 1.9 oz (78.1 kg), SpO2 98 %.  GEN:  Elderly male,  NAD  HEENT: Normal NECK: No JVD; No carotid bruits LYMPHATICS: No lymphadenopathy CARDIAC: RR, no murmurs, rubs, gallops RESPIRATORY:  Clear to auscultation without rales, wheezing or  rhonchi  ABDOMEN: Soft, non-tender, non-distended MUSCULOSKELETAL:  No edema; No deformity  SKIN: Warm and dry NEUROLOGIC:  Alert and oriented x 3   EKG: January 04, 2018: Normal sinus rhythm at 72.  Nonspecific ST ab normality.   Recent Labs: No results found for requested labs within last 8760 hours.    Lipid Panel    Component Value Date/Time   CHOL 125 11/26/2015 1511   TRIG 59 11/26/2015 1511   HDL 68 11/26/2015 1511   CHOLHDL 1.8 11/26/2015 1511   VLDL 12 11/26/2015 1511   LDLCALC 45 11/26/2015 1511      Wt Readings from Last 3 Encounters:  01/04/18 172 lb 1.9 oz (78.1 kg)  12/29/16 172 lb 12.8 oz (78.4 kg)  11/26/15 174 lb 6.4 oz (79.1 kg)      Other studies Reviewed: Additional studies/ records that were reviewed today include: . Review of the above records demonstrates:    ASSESSMENT AND PLAN:  1.  CAD :   He has moderate coronary artery disease. There were no obstructive lesions. He's not having any episodes of angina.  Works in his garden and walks regularly .  No angina.   Labs are checked by his primary MD     2. Essential hypertension: BP has been well controlled.   3. Hyperlipidemia: Has labs checked by his primary MD     Current medicines are reviewed at length with the patient today.  The patient does not have concerns regarding medicines.  Labs/ tests ordered today include:  No orders of the defined types were placed in this encounter.    Mertie Moores, MD  01/04/2018 8:26 AM    [facility], [place], [place]  [address] Phone: [phone]; Fax: [phone]

## 2018-01-04 NOTE — Patient Instructions (Signed)
Medication Instructions:  Your physician recommends that you continue on your current medications as directed. Please refer to the Current Medication list given to you today.   Labwork: None Ordered   Testing/Procedures: None Ordered   Follow-Up: Your physician wants you to follow-up in: 1 year with Dr. Nahser.  You will receive a reminder letter in the mail two months in advance. If you don't receive a letter, please call our office to schedule the follow-up appointment.   If you need a refill on your cardiac medications before your next appointment, please call your pharmacy.   Thank you for choosing [facility]! Takai Chiaramonte, RN [phone]    

## 2018-01-12 DIAGNOSIS — E119 Type 2 diabetes mellitus without complications: Secondary | ICD-10-CM | POA: Diagnosis not present

## 2018-02-18 DIAGNOSIS — Z23 Encounter for immunization: Secondary | ICD-10-CM | POA: Diagnosis not present

## 2018-04-14 DIAGNOSIS — E1169 Type 2 diabetes mellitus with other specified complication: Secondary | ICD-10-CM | POA: Diagnosis not present

## 2018-04-14 DIAGNOSIS — E782 Mixed hyperlipidemia: Secondary | ICD-10-CM | POA: Diagnosis not present

## 2018-04-14 DIAGNOSIS — I2510 Atherosclerotic heart disease of native coronary artery without angina pectoris: Secondary | ICD-10-CM | POA: Diagnosis not present

## 2018-04-14 DIAGNOSIS — I10 Essential (primary) hypertension: Secondary | ICD-10-CM | POA: Diagnosis not present

## 2018-04-20 DIAGNOSIS — E119 Type 2 diabetes mellitus without complications: Secondary | ICD-10-CM | POA: Diagnosis not present

## 2018-07-20 DIAGNOSIS — E119 Type 2 diabetes mellitus without complications: Secondary | ICD-10-CM | POA: Diagnosis not present

## 2018-10-13 DIAGNOSIS — I10 Essential (primary) hypertension: Secondary | ICD-10-CM | POA: Diagnosis not present

## 2018-10-13 DIAGNOSIS — Z1159 Encounter for screening for other viral diseases: Secondary | ICD-10-CM | POA: Diagnosis not present

## 2018-10-13 DIAGNOSIS — E1169 Type 2 diabetes mellitus with other specified complication: Secondary | ICD-10-CM | POA: Diagnosis not present

## 2018-10-13 DIAGNOSIS — I2510 Atherosclerotic heart disease of native coronary artery without angina pectoris: Secondary | ICD-10-CM | POA: Diagnosis not present

## 2018-10-13 DIAGNOSIS — E782 Mixed hyperlipidemia: Secondary | ICD-10-CM | POA: Diagnosis not present

## 2018-10-15 DIAGNOSIS — I10 Essential (primary) hypertension: Secondary | ICD-10-CM | POA: Diagnosis not present

## 2018-10-15 DIAGNOSIS — E782 Mixed hyperlipidemia: Secondary | ICD-10-CM | POA: Diagnosis not present

## 2018-10-15 DIAGNOSIS — E1169 Type 2 diabetes mellitus with other specified complication: Secondary | ICD-10-CM | POA: Diagnosis not present

## 2018-10-26 DIAGNOSIS — E119 Type 2 diabetes mellitus without complications: Secondary | ICD-10-CM | POA: Diagnosis not present

## 2019-01-12 DIAGNOSIS — Z7984 Long term (current) use of oral hypoglycemic drugs: Secondary | ICD-10-CM | POA: Diagnosis not present

## 2019-01-12 DIAGNOSIS — H2513 Age-related nuclear cataract, bilateral: Secondary | ICD-10-CM | POA: Diagnosis not present

## 2019-01-12 DIAGNOSIS — E119 Type 2 diabetes mellitus without complications: Secondary | ICD-10-CM | POA: Diagnosis not present

## 2019-01-25 ENCOUNTER — Ambulatory Visit (INDEPENDENT_AMBULATORY_CARE_PROVIDER_SITE_OTHER): Payer: Medicare Other | Admitting: Cardiovascular Disease

## 2019-01-25 ENCOUNTER — Other Ambulatory Visit: Payer: Self-pay

## 2019-01-25 ENCOUNTER — Encounter: Payer: Self-pay | Admitting: Cardiovascular Disease

## 2019-01-25 VITALS — BP 122/70 | HR 69 | Ht 64.0 in | Wt 178.8 lb

## 2019-01-25 DIAGNOSIS — I10 Essential (primary) hypertension: Secondary | ICD-10-CM

## 2019-01-25 DIAGNOSIS — I2510 Atherosclerotic heart disease of native coronary artery without angina pectoris: Secondary | ICD-10-CM | POA: Diagnosis not present

## 2019-01-25 NOTE — Patient Instructions (Signed)
Medication Instructions:  Your physician recommends that you continue on your current medications as directed. Please refer to the Current Medication list given to you today.  If you need a refill on your cardiac medications before your next appointment, please call your pharmacy.   Lab work: None If you have labs (blood work) drawn today and your tests are completely normal, you will receive your results only by: . MyChart Message (if you have MyChart) OR . A paper copy in the mail If you have any lab test that is abnormal or we need to change your treatment, we will call you to review the results.  Testing/Procedures: None  Follow-Up: At [facility], you and your health needs are our priority.  As part of our continuing mission to provide you with exceptional heart care, we have created designated Provider Care Teams.  These Care Teams include your primary Cardiologist (physician) and Advanced Practice Providers (APPs -  Physician Assistants and Nurse Practitioners) who all work together to provide you with the care you need, when you need it. You will need a follow up appointment in:  12 months.  Please call our office 2 months in advance to schedule this appointment.  You may see Philip Nahser, MD or one of the following Advanced Practice Providers on your designated Care Team: Scott Weaver, PA-C Vin Bhagat, PA-C . Janine Hammond, NP  Any Other Special Instructions Will Be Listed Below (If Applicable).    

## 2019-01-25 NOTE — Progress Notes (Signed)
Cardiology Office Note   Date:  01/25/2019   ID:  Lance, Niewinski Riddle 03, 1947, MRN [id number]  PCP:  Lillard Anes, MD  Cardiologist:   Mertie Moores, MD  ( previoiusly [facility])   No chief complaint on file.  Problem list 1. Coronary artery disease 2. Hypertension 3. Hyperlipidemia     Lance Riddle is a 73 y.o. male who presents for his CAD by cath in 1994.  Previous patient of Hochrein.    Has been having some sharp pain .   Not related to exercise.  The pains are not related to eating or drinking, twisting or turning. Not related to taking a deep breath. Last a split second Walks 2 miles every am - no CP with this. Does have some leg cramps  Also has leg cramps at night .   Aug. 20, 2018:  Lance Riddle is a 73 yo with hx of mild, nonobstructive CAD. Rough year, lost his wife this past June.   Married 44 years.  Has been active.  Works in the garden, [place] the yard regularly .    No CP or dyspnea.   Labs from primary MD shows : Chol = 136 Trigs  = 68 HDL = 65 LDL = 57  January 04, 2018:  Santhiago is seen today for follow-up visit of his hypertension and hyperlipidemia.  He has a history of mild, nonobstructive coronary artery disease. Grew a garden again this year. Lots of veggies  Had labs with his primary MD earlier this month   Sept. 15, 2020  Lance Riddle  Is  seen today for follow-up of his hypertension, hyperlipidemia mild coronary artery disease. Active Works in the garden .  Had a large garden .  66 tomato plants Walks  No CP or dyspnea.    Labs from his primary medical doctor from June reveals a total cholesterol of 127.  The HDL is 58.  The LDL is 57.  The triglyceride level is 60.  Hemoglobin A1c is 6.7.  Hemoglobin is 13.9.  Liver enzymes are normal.  Potassium is 4.7.  Past Medical History:  Diagnosis Date  . Acute myocardial infarction, unspecified site, episode of care unspecified   . Coronary atherosclerosis of native coronary artery    2007  catheterization D1 70%, ramus intermediate 40%, large RV branch off the right coronary artery 60-70%.; Lexiscan Myoview (03/2013):  No ischemia, EF 58% (normal study).  . Other and unspecified hyperlipidemia   . Type II or unspecified type diabetes mellitus without mention of complication, not stated as uncontrolled    type 2  . Unspecified essential hypertension     Past Surgical History:  Procedure Laterality Date  . TONSILLECTOMY AND ADENOIDECTOMY       Current Outpatient Medications  Medication Sig Dispense Refill  . amLODipine (NORVASC) 10 MG tablet Take 1 tablet (10 mg total) by mouth daily. 90 tablet 3  . aspirin 81 MG tablet Take 81 mg by mouth daily.      . finasteride (PROSCAR) 5 MG tablet Take 5 mg by mouth daily.    Marland Kitchen glimepiride (AMARYL) 4 MG tablet Take 4 mg by mouth daily.      Marland Kitchen lisinopril-hydrochlorothiazide (PRINZIDE,ZESTORETIC) 20-12.5 MG per tablet Take 1 tablet by mouth daily. 90 tablet 3  . metFORMIN (GLUCOPHAGE) 500 MG tablet Take 500 mg by mouth 2 (two) times daily.      . multivitamin (THERAGRAN) per tablet Take 1 tablet by mouth daily.      Marland Kitchen  nitroGLYCERIN (NITROSTAT) 0.4 MG SL tablet Place 0.4 mg under the tongue every 5 (five) minutes as needed for chest pain.     Marland Kitchen omeprazole (PRILOSEC) 20 MG capsule Take 20 mg by mouth daily.    . simvastatin (ZOCOR) 40 MG tablet TAKE ONE TABLET (40MG  TOTAL) BY MOUTH AT BEDTIME 30 tablet 6  . tamsulosin (FLOMAX) 0.4 MG CAPS capsule Take 0.4 mg by mouth daily.     No current facility-administered medications for this visit.     Allergies:   Patient has no known allergies.    Social History:  The patient  reports that he has quit smoking. His smokeless tobacco use includes chew.   Family History:  The patient's family history includes Cancer in his brother; Coronary artery disease in an other family member; Heart attack in his mother; Lung cancer in his father.    ROS: Noted in current history, otherwise review of  systems is negative.  Physical Exam: Blood pressure 122/70, pulse 69, height 5\' 4"  (1.626 m), weight 178 lb 12.8 oz (81.1 kg), SpO2 98 %.  GEN:  Well nourished, well developed in no acute distress HEENT: Normal NECK: No JVD; No carotid bruits LYMPHATICS: No lymphadenopathy CARDIAC: RRR , no murmurs, rubs, gallops RESPIRATORY:  Clear to auscultation without rales, wheezing or rhonchi  ABDOMEN: Soft, non-tender, non-distended MUSCULOSKELETAL:  No edema; No deformity  SKIN: Warm and dry NEUROLOGIC:  Alert and oriented x 3    EKG:  Sept. 15, 2020 :   NSR at 40.  Normal      Recent Labs: No results found for requested labs within last 8760 hours.    Lipid Panel    Component Value Date/Time   CHOL 125 11/26/2015 1511   TRIG 59 11/26/2015 1511   HDL 68 11/26/2015 1511   CHOLHDL 1.8 11/26/2015 1511   VLDL 12 11/26/2015 1511   LDLCALC 45 11/26/2015 1511      Wt Readings from Last 3 Encounters:  01/25/19 178 lb 12.8 oz (81.1 kg)  01/04/18 172 lb 1.9 oz (78.1 kg)  12/29/16 172 lb 12.8 oz (78.4 kg)      Other studies Reviewed: Additional studies/ records that were reviewed today include: . Review of the above records demonstrates:    ASSESSMENT AND PLAN:  1.  CAD : Lance Riddle is doing well.  He has mild to moderate coronary artery disease.  He remains very active and does not had any symptoms.    2. Essential hypertension: Blood pressures well controlled.  Continue current medications.  3. Hyperlipidemia: His last labs from his primary medical doctor look great.  Continue current medications.  We will be rechecking labs again in a month or so.    Current medicines are reviewed at length with the patient today.  The patient does not have concerns regarding medicines.  Labs/ tests ordered today include:   Orders Placed This Encounter  Procedures  . EKG 12-Lead     Mertie Moores, MD  01/25/2019 10:06 AM    [facility] [place],  [place],   [address] Phone: [phone]; Fax: [phone]

## 2019-02-14 DIAGNOSIS — E782 Mixed hyperlipidemia: Secondary | ICD-10-CM | POA: Diagnosis not present

## 2019-02-14 DIAGNOSIS — I10 Essential (primary) hypertension: Secondary | ICD-10-CM | POA: Diagnosis not present

## 2019-02-14 DIAGNOSIS — E1169 Type 2 diabetes mellitus with other specified complication: Secondary | ICD-10-CM | POA: Diagnosis not present

## 2019-02-14 DIAGNOSIS — Z23 Encounter for immunization: Secondary | ICD-10-CM | POA: Diagnosis not present

## 2019-02-21 DIAGNOSIS — E119 Type 2 diabetes mellitus without complications: Secondary | ICD-10-CM | POA: Diagnosis not present

## 2019-06-14 DIAGNOSIS — E119 Type 2 diabetes mellitus without complications: Secondary | ICD-10-CM | POA: Diagnosis not present

## 2019-06-16 DIAGNOSIS — K2100 Gastro-esophageal reflux disease with esophagitis, without bleeding: Secondary | ICD-10-CM

## 2019-06-16 DIAGNOSIS — E1169 Type 2 diabetes mellitus with other specified complication: Secondary | ICD-10-CM

## 2019-06-16 DIAGNOSIS — F4321 Adjustment disorder with depressed mood: Secondary | ICD-10-CM | POA: Insufficient documentation

## 2019-06-16 DIAGNOSIS — N138 Other obstructive and reflux uropathy: Secondary | ICD-10-CM | POA: Insufficient documentation

## 2019-06-16 HISTORY — DX: Benign prostatic hyperplasia with lower urinary tract symptoms: N13.8

## 2019-06-16 HISTORY — DX: Gastro-esophageal reflux disease with esophagitis, without bleeding: K21.00

## 2019-06-16 HISTORY — DX: Adjustment disorder with depressed mood: F43.21

## 2019-06-16 HISTORY — DX: Type 2 diabetes mellitus with other specified complication: E11.69

## 2019-06-17 ENCOUNTER — Other Ambulatory Visit: Payer: Self-pay

## 2019-06-17 ENCOUNTER — Ambulatory Visit (INDEPENDENT_AMBULATORY_CARE_PROVIDER_SITE_OTHER): Payer: Medicare Other | Admitting: Legal Medicine

## 2019-06-17 ENCOUNTER — Encounter: Payer: Self-pay | Admitting: Legal Medicine

## 2019-06-17 DIAGNOSIS — K2100 Gastro-esophageal reflux disease with esophagitis, without bleeding: Secondary | ICD-10-CM | POA: Diagnosis not present

## 2019-06-17 DIAGNOSIS — N401 Enlarged prostate with lower urinary tract symptoms: Secondary | ICD-10-CM | POA: Diagnosis not present

## 2019-06-17 DIAGNOSIS — E1169 Type 2 diabetes mellitus with other specified complication: Secondary | ICD-10-CM

## 2019-06-17 DIAGNOSIS — R739 Hyperglycemia, unspecified: Secondary | ICD-10-CM | POA: Diagnosis not present

## 2019-06-17 DIAGNOSIS — F4321 Adjustment disorder with depressed mood: Secondary | ICD-10-CM

## 2019-06-17 DIAGNOSIS — E782 Mixed hyperlipidemia: Secondary | ICD-10-CM

## 2019-06-17 DIAGNOSIS — N138 Other obstructive and reflux uropathy: Secondary | ICD-10-CM

## 2019-06-17 LAB — POCT UA - MICROALBUMIN: Microalbumin Ur, POC: 30 mg/L

## 2019-06-17 NOTE — Progress Notes (Signed)
Established Patient Office Visit  Subjective:  Patient ID: Lance Riddle, male    DOB: [birth year]  Age: 74 y.o. MRN: [id number]  CC:  Chief Complaint  Patient presents with  . Diabetes  . Hyperlipidemia    HPI Lance Riddle presents for Chronic visit  Patient present with type 2 diabetes.  Specifically, this is type 2, noninsulin requiring diabetes, complicated by hypertension and hyperlipidemia.  Compliance with treatment has been good; patient take medicines as directed, maintains diet and exercise regimen, follows up as directed, and is keeping glucose diary.  Date of  diagnosis 2010.  Depression screen has been performed.Tobacco screen nonsmoker. Current medicines for diabetes metformin.  Patient is on lisinopril for renal protection and simvastatin for cholesterol control.  Patient performs foot exams daily and last ophthalmologic exam was last year.  Patient presents with hyperlipidemia.  Compliance with treatment has been good; patient takes medicines as directed, maintains low cholesterol diet, follows up as directed, and maintains exercise regimen.  Patient is using medicines without problems.    Past Medical History:  Diagnosis Date  . Acute myocardial infarction, unspecified site, episode of care unspecified   . Coronary atherosclerosis of native coronary artery    2007 catheterization D1 70%, ramus intermediate 40%, large RV branch off the right coronary artery 60-70%.; Lexiscan Myoview (03/2013):  No ischemia, EF 58% (normal study).  . Other and unspecified hyperlipidemia   . Type II or unspecified type diabetes mellitus without mention of complication, not stated as uncontrolled    type 2  . Unspecified essential hypertension     Past Surgical History:  Procedure Laterality Date  . ANGIOPLASTY  1994  . CARDIAC SURGERY    . TONSILLECTOMY AND ADENOIDECTOMY      Family History  Problem Relation Age of Onset  . Heart attack Mother   . Coronary artery disease  Mother   . Lung cancer Father   . Diabetes Father   . Hypertension Father   . Cancer Brother   . Coronary artery disease Other        paternal side of family    Social History   Socioeconomic History  . Marital status: Married    Spouse name: Not on file  . Number of children: Not on file  . Years of education: Not on file  . Highest education level: Not on file  Occupational History  . Occupation: farmer  Tobacco Use  . Smoking status: Former Research scientist (life sciences)  . Smokeless tobacco: Current User    Types: Chew  Substance and Sexual Activity  . Alcohol use: Not on file  . Drug use: Not on file  . Sexual activity: Not on file  Other Topics Concern  . Not on file  Social History Narrative  . Not on file   Social Determinants of Health   Financial Resource Strain:   . Difficulty of Paying Living Expenses: Not on file  Food Insecurity:   . Worried About Charity fundraiser in the Last Year: Not on file  . Ran Out of Food in the Last Year: Not on file  Transportation Needs:   . Lack of Transportation (Medical): Not on file  . Lack of Transportation (Non-Medical): Not on file  Physical Activity:   . Days of Exercise per Week: Not on file  . Minutes of Exercise per Session: Not on file  Stress:   . Feeling of Stress : Not on file  Social Connections:   . Frequency of  Communication with Friends and Family: Not on file  . Frequency of Social Gatherings with Friends and Family: Not on file  . Attends Religious Services: Not on file  . Active Member of Clubs or Organizations: Not on file  . Attends Archivist Meetings: Not on file  . Marital Status: Not on file  Intimate Partner Violence:   . Fear of Current or Ex-Partner: Not on file  . Emotionally Abused: Not on file  . Physically Abused: Not on file  . Sexually Abused: Not on file    Outpatient Medications Prior to Visit  Medication Sig Dispense Refill  . amLODipine (NORVASC) 10 MG tablet Take 1 tablet (10 mg  total) by mouth daily. 90 tablet 3  . aspirin 81 MG tablet Take 81 mg by mouth daily.      . finasteride (PROSCAR) 5 MG tablet Take 5 mg by mouth daily.    Marland Kitchen glimepiride (AMARYL) 4 MG tablet Take 4 mg by mouth daily.      Marland Kitchen lisinopril-hydrochlorothiazide (PRINZIDE,ZESTORETIC) 20-12.5 MG per tablet Take 1 tablet by mouth daily. 90 tablet 3  . metFORMIN (GLUCOPHAGE) 500 MG tablet Take 500 mg by mouth 2 (two) times daily.      . nitroGLYCERIN (NITROSTAT) 0.4 MG SL tablet Place 0.4 mg under the tongue every 5 (five) minutes as needed for chest pain.     Marland Kitchen omeprazole (PRILOSEC) 20 MG capsule Take 20 mg by mouth daily.    . simvastatin (ZOCOR) 40 MG tablet TAKE ONE TABLET (40MG  TOTAL) BY MOUTH AT BEDTIME 30 tablet 6  . tamsulosin (FLOMAX) 0.4 MG CAPS capsule Take 0.4 mg by mouth daily.    . multivitamin (THERAGRAN) per tablet Take 1 tablet by mouth daily.       No facility-administered medications prior to visit.    No Known Allergies  ROS Review of Systems  Constitutional: Negative.   HENT: Negative.   Eyes: Negative.   Respiratory: Negative.   Cardiovascular: Negative.   Gastrointestinal: Negative.   Endocrine: Negative.   Genitourinary: Negative.   Musculoskeletal: Negative.   Neurological: Negative.   Psychiatric/Behavioral: Negative.       Objective:    Physical Exam  Constitutional: He is oriented to person, place, and time. He appears well-developed and well-nourished.  HENT:  Head: Normocephalic and atraumatic.  Eyes: Pupils are equal, round, and reactive to light. Conjunctivae and EOM are normal.  Cardiovascular: Normal rate, regular rhythm and normal heart sounds.  Pulmonary/Chest: Breath sounds normal. He is in respiratory distress.  Abdominal: Soft. Bowel sounds are normal.  Musculoskeletal:        General: Normal range of motion.     Cervical back: Normal range of motion and neck supple.  Neurological: He is alert and oriented to person, place, and time. He has  normal reflexes.  Skin: Skin is warm and dry.  Psychiatric: He has a normal mood and affect.    BP 114/62   Pulse 71   Temp 97.7 F (36.5 C)   Resp 18   Wt 178 lb 12.8 oz (81.1 kg)   SpO2 97%   BMI 30.69 kg/m  Wt Readings from Last 3 Encounters:  06/17/19 178 lb 12.8 oz (81.1 kg)  01/25/19 178 lb 12.8 oz (81.1 kg)  01/04/18 172 lb 1.9 oz (78.1 kg)     Health Maintenance Due  Topic Date Due  . HEMOGLOBIN A1C  1945/10/15  . Hepatitis C Screening  08/02/1945  . OPHTHALMOLOGY EXAM  06/23/1957  .  TETANUS/TDAP  06/23/1966  . PNA vac Low Risk Adult (1 of 2 - PCV13) 06/23/2012  . INFLUENZA VACCINE  12/11/2018    There are no preventive care reminders to display for this patient.  No results found for: TSH Lab Results  Component Value Date   WBC 7.2 06/17/2019   HGB 14.5 06/17/2019   HCT 43.1 06/17/2019   MCV 91 06/17/2019   PLT 223 06/17/2019   Lab Results  Component Value Date   NA 135 06/17/2019   K 5.0 06/17/2019   CO2 20 06/17/2019   GLUCOSE 121 (H) 06/17/2019   BUN 15 06/17/2019   CREATININE 1.02 06/17/2019   BILITOT 0.3 06/17/2019   ALKPHOS 46 06/17/2019   AST 10 06/17/2019   ALT 13 06/17/2019   PROT 7.2 06/17/2019   ALBUMIN 4.7 06/17/2019   CALCIUM 10.0 06/17/2019   Lab Results  Component Value Date   CHOL 132 06/17/2019   Lab Results  Component Value Date   HDL 58 06/17/2019   Lab Results  Component Value Date   LDLCALC 59 06/17/2019   Lab Results  Component Value Date   TRIG 73 06/17/2019   Lab Results  Component Value Date   CHOLHDL 2.3 06/17/2019   No results found for: HGBA1C    Assessment & Plan:   Problem List Items Addressed This Visit      Digestive   Reflux esophagitis (Chronic)    An individualized care plan was established and reinforced.  The patient's disease status was assessed using clinical findings on exam, labs, and other diagnostic tests..  The patient's success in meeting treatment goals is based on disease  specific evidence-based guidelines and patient is found to be in good control.  Patient is to continue medicines.         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  The patient's status was assessed using clinical findings on exam, labs and diagnostic testing. Patient success at meeting goals based on disease specific evidence-based guidelines and found to be good controlled. Medications were assessed and patient's understanding of the medical issues , including barriers were assessed. Recommend adherence to a diabetic diet, a graduated exercise program, HgbA1c level is checked quarterly, and urine microalbumin performed yearly .  Annual mono-filament sensation testing performed. Lower blood pressure and control hyperlipidemia is important. Get annual eye exams and annual flu shots and smoking cessation discussed.  Self management goals were discussed.      Relevant Orders   POCT UA - Microalbumin (Completed)   CBC with Differential (Completed)   Comprehensive metabolic panel (Completed)   Lipid Panel (Completed)     Genitourinary   Enlarged prostate with urinary obstruction (Chronic)    AN INDIVIDUAL CARE PLAN was established and reinforced today.  The patient's status was assessed using clinical findings on exam, labs, and other diagnostic testing. Patient's success at meeting treatment goals based on disease specific evidence-bassed guidelines and found to be in good control. RECOMMENDATIONS include maintaining present medicines and treatment.        Other   Adjustment disorder with depressed mood    AN INDIVIDUAL CARE PLAN was established and reinforced today.  The patient's status was assessed using clinical findings on exam, labs, and other diagnostic testing. Patient's success at meeting treatment goals based on disease specific evidence-bassed guidelines and found to  be in good control. RECOMMENDATIONS include maintaining  present medicines and treatment.PHQ9 is improving          No orders of the defined types were placed in this encounter.   Follow-up: Return in about 4 months (around 10/15/2019).    Reinaldo Meeker, MD

## 2019-06-18 LAB — CBC WITH DIFFERENTIAL/PLATELET
Basophils Absolute: 0 10*3/uL (ref 0.0–0.2)
Basos: 1 %
EOS (ABSOLUTE): 0.2 10*3/uL (ref 0.0–0.4)
Eos: 2 %
Hematocrit: 43.1 % (ref 37.5–51.0)
Hemoglobin: 14.5 g/dL (ref 13.0–17.7)
Immature Grans (Abs): 0 10*3/uL (ref 0.0–0.1)
Immature Granulocytes: 0 %
Lymphocytes Absolute: 1.7 10*3/uL (ref 0.7–3.1)
Lymphs: 23 %
MCH: 30.5 pg (ref 26.6–33.0)
MCHC: 33.6 g/dL (ref 31.5–35.7)
MCV: 91 fL (ref 79–97)
Monocytes Absolute: 0.9 10*3/uL (ref 0.1–0.9)
Monocytes: 12 %
Neutrophils Absolute: 4.4 10*3/uL (ref 1.4–7.0)
Neutrophils: 62 %
Platelets: 223 10*3/uL (ref 150–450)
RBC: 4.76 x10E6/uL (ref 4.14–5.80)
RDW: 12.7 % (ref 11.6–15.4)
WBC: 7.2 10*3/uL (ref 3.4–10.8)

## 2019-06-18 LAB — COMPREHENSIVE METABOLIC PANEL
ALT: 13 IU/L (ref 0–44)
AST: 10 IU/L (ref 0–40)
Albumin/Globulin Ratio: 1.9 (ref 1.2–2.2)
Albumin: 4.7 g/dL (ref 3.7–4.7)
Alkaline Phosphatase: 46 IU/L (ref 39–117)
BUN/Creatinine Ratio: 15 (ref 10–24)
BUN: 15 mg/dL (ref 8–27)
Bilirubin Total: 0.3 mg/dL (ref 0.0–1.2)
CO2: 20 mmol/L (ref 20–29)
Calcium: 10 mg/dL (ref 8.6–10.2)
Chloride: 98 mmol/L (ref 96–106)
Creatinine, Ser: 1.02 mg/dL (ref 0.76–1.27)
GFR calc Af Amer: 85 mL/min/{1.73_m2} (ref >59)
GFR calc non Af Amer: 74 mL/min/{1.73_m2} (ref >59)
Globulin, Total: 2.5 g/dL (ref 1.5–4.5)
Glucose: 121 mg/dL — ABNORMAL HIGH (ref 65–99)
Potassium: 5 mmol/L (ref 3.5–5.2)
Sodium: 135 mmol/L (ref 134–144)
Total Protein: 7.2 g/dL (ref 6.0–8.5)

## 2019-06-18 LAB — LIPID PANEL
Chol/HDL Ratio: 2.3 ratio (ref 0.0–5.0)
Cholesterol, Total: 132 mg/dL (ref 100–199)
HDL: 58 mg/dL (ref >39)
LDL Chol Calc (NIH): 59 mg/dL (ref 0–99)
Triglycerides: 73 mg/dL (ref 0–149)
VLDL Cholesterol Cal: 15 mg/dL (ref 5–40)

## 2019-06-18 LAB — CARDIOVASCULAR RISK ASSESSMENT

## 2019-06-18 NOTE — Assessment & Plan Note (Signed)
An individualized care plan was established and reinforced.  The patient's disease status was assessed using clinical findings on exam, labs, and other diagnostic tests..  The patient's success in meeting treatment goals is based on disease specific evidence-based guidelines and patient is found to be in good control.  Patient is to continue medicines.  

## 2019-06-18 NOTE — Assessment & Plan Note (Signed)
AN INDIVIDUAL CARE PLAN was established and reinforced today.  The patient's status was assessed using clinical findings on exam, labs, and other diagnostic testing. Patient's success at meeting treatment goals based on disease specific evidence-bassed guidelines and found to be in good control. RECOMMENDATIONS include maintaining present medicines and treatment.PHQ9 is improving

## 2019-06-18 NOTE — Assessment & Plan Note (Signed)
AN INDIVIDUAL CARE PLAN was established and reinforced today.  The patient's status was assessed using clinical findings on exam, labs, and other diagnostic testing. Patient's success at meeting treatment goals based on disease specific evidence-bassed guidelines and found to be in good control. RECOMMENDATIONS include maintaining present medicines and treatment. 

## 2019-06-18 NOTE — Assessment & Plan Note (Signed)

## 2019-06-19 NOTE — Progress Notes (Signed)
No anemia, glucose 121, kidney and liver tests OK, Cholesterol is normal, add A1c lp

## 2019-06-20 ENCOUNTER — Other Ambulatory Visit: Payer: Self-pay

## 2019-06-20 DIAGNOSIS — R7309 Other abnormal glucose: Secondary | ICD-10-CM

## 2019-06-22 LAB — HGB A1C W/O EAG: Hgb A1c MFr Bld: 6.4 % — ABNORMAL HIGH (ref 4.8–5.6)

## 2019-06-22 LAB — SPECIMEN STATUS REPORT

## 2019-06-22 NOTE — Progress Notes (Signed)
A1c 6.4 borderline diabetes.  Need to be on diabetic diet, come in for nurse visit to discuss this.

## 2019-09-09 DIAGNOSIS — E119 Type 2 diabetes mellitus without complications: Secondary | ICD-10-CM | POA: Diagnosis not present

## 2019-10-01 ENCOUNTER — Other Ambulatory Visit: Payer: Self-pay | Admitting: Legal Medicine

## 2019-10-01 DIAGNOSIS — I2510 Atherosclerotic heart disease of native coronary artery without angina pectoris: Secondary | ICD-10-CM

## 2019-10-14 ENCOUNTER — Encounter: Payer: Self-pay | Admitting: Legal Medicine

## 2019-10-14 ENCOUNTER — Ambulatory Visit (INDEPENDENT_AMBULATORY_CARE_PROVIDER_SITE_OTHER): Payer: Medicare Other | Admitting: Legal Medicine

## 2019-10-14 ENCOUNTER — Other Ambulatory Visit: Payer: Self-pay

## 2019-10-14 VITALS — BP 124/72 | HR 64 | Temp 97.4°F | Ht 63.0 in | Wt 176.6 lb

## 2019-10-14 DIAGNOSIS — E669 Obesity, unspecified: Secondary | ICD-10-CM

## 2019-10-14 DIAGNOSIS — I10 Essential (primary) hypertension: Secondary | ICD-10-CM

## 2019-10-14 DIAGNOSIS — E1169 Type 2 diabetes mellitus with other specified complication: Secondary | ICD-10-CM | POA: Diagnosis not present

## 2019-10-14 DIAGNOSIS — K2100 Gastro-esophageal reflux disease with esophagitis, without bleeding: Secondary | ICD-10-CM

## 2019-10-14 DIAGNOSIS — N138 Other obstructive and reflux uropathy: Secondary | ICD-10-CM

## 2019-10-14 DIAGNOSIS — E1159 Type 2 diabetes mellitus with other circulatory complications: Secondary | ICD-10-CM

## 2019-10-14 DIAGNOSIS — I2510 Atherosclerotic heart disease of native coronary artery without angina pectoris: Secondary | ICD-10-CM

## 2019-10-14 DIAGNOSIS — F4321 Adjustment disorder with depressed mood: Secondary | ICD-10-CM | POA: Diagnosis not present

## 2019-10-14 DIAGNOSIS — Z6832 Body mass index (BMI) 32.0-32.9, adult: Secondary | ICD-10-CM | POA: Insufficient documentation

## 2019-10-14 DIAGNOSIS — Z6833 Body mass index (BMI) 33.0-33.9, adult: Secondary | ICD-10-CM | POA: Insufficient documentation

## 2019-10-14 DIAGNOSIS — N401 Enlarged prostate with lower urinary tract symptoms: Secondary | ICD-10-CM

## 2019-10-14 DIAGNOSIS — E782 Mixed hyperlipidemia: Secondary | ICD-10-CM | POA: Diagnosis not present

## 2019-10-14 DIAGNOSIS — I152 Hypertension secondary to endocrine disorders: Secondary | ICD-10-CM

## 2019-10-14 DIAGNOSIS — Z6831 Body mass index (BMI) 31.0-31.9, adult: Secondary | ICD-10-CM

## 2019-10-14 HISTORY — DX: Body mass index (BMI) 31.0-31.9, adult: Z68.31

## 2019-10-14 MED ORDER — OMEPRAZOLE 20 MG PO CPDR: 20.0000 mg | DELAYED_RELEASE_CAPSULE | Freq: Every day | ORAL | 2 refills | Status: DC

## 2019-10-14 MED ORDER — AMLODIPINE BESYLATE 10 MG PO TABS: 10.0000 mg | ORAL_TABLET | Freq: Every day | ORAL | 2 refills | Status: DC

## 2019-10-14 MED ORDER — SIMVASTATIN 40 MG PO TABS: 40.0000 mg | ORAL_TABLET | Freq: Every day | ORAL | 2 refills | Status: DC

## 2019-10-14 MED ORDER — FINASTERIDE 5 MG PO TABS: 5.0000 mg | ORAL_TABLET | Freq: Every day | ORAL | 2 refills | Status: DC

## 2019-10-14 MED ORDER — METFORMIN HCL 500 MG PO TABS: 500.0000 mg | ORAL_TABLET | Freq: Two times a day (BID) | ORAL | 2 refills | Status: DC

## 2019-10-14 MED ORDER — GLIMEPIRIDE 4 MG PO TABS: 4.0000 mg | ORAL_TABLET | Freq: Every day | ORAL | 2 refills | Status: DC

## 2019-10-14 MED ORDER — TAMSULOSIN HCL 0.4 MG PO CAPS: 0.4000 mg | ORAL_CAPSULE | Freq: Every day | ORAL | 2 refills | Status: DC

## 2019-10-14 MED ORDER — LISINOPRIL-HYDROCHLOROTHIAZIDE 20-12.5 MG PO TABS: 1.0000 | ORAL_TABLET | Freq: Every day | ORAL | 2 refills | Status: DC

## 2019-10-14 NOTE — Progress Notes (Signed)
Established Patient Office Visit  Subjective:  Patient ID: Lance Riddle, male    DOB: [birth year]  Age: 74 y.o. MRN: [id number]  CC:  Chief Complaint  Patient presents with  . Hyperlipidemia  . Hypertension  . Diabetes    HPI Lance Riddle presents for Chronic visit.  Patient present with type 2 diabetes.  Specifically, this is type 2, noninsulin requiring diabetes, complicated by hypercholesterolemia and hypertension.  Compliance with treatment has been good; patient take medicines as directed, maintains diet and exercise regimen, follows up as directed, and is keeping glucose diary.  Date of  diagnosis 2010.  Depression screen has been performed.Tobacco screen nonsmoker. Current medicines for diabetes glimipiride.  Patient is on lisinopril for renal protection and simvastatin for cholesterol control.  Patient performs foot exams daily and last ophthalmologic exam was last year.  Patient presents for follow up of hypertension.  Patient tolerating lisinopril/HCTZ well with side effects.  Patient was diagnosed with hypertension 2010 so has been treated for hypertension for 10 years.Patient is working on maintaining diet and exercise regimen and follows up as directed. Complication include cad.  Patient presents with hyperlipidemia.  Compliance with treatment has been good; patient takes medicines as directed, maintains low cholesterol diet, follows up as directed, and maintains exercise regimen.  Patient is using simvastatin without problems.  Past Medical History:  Diagnosis Date  . Acute myocardial infarction, unspecified site, episode of care unspecified   . Coronary atherosclerosis of native coronary artery    2007 catheterization D1 70%, ramus intermediate 40%, large RV branch off the right coronary artery 60-70%.; Lexiscan Myoview (03/2013):  No ischemia, EF 58% (normal study).  . Other and unspecified hyperlipidemia   . Type II or unspecified type diabetes mellitus without  mention of complication, not stated as uncontrolled    type 2  . Unspecified essential hypertension     Past Surgical History:  Procedure Laterality Date  . ANGIOPLASTY  1994  . CARDIAC SURGERY    . TONSILLECTOMY AND ADENOIDECTOMY      Family History  Problem Relation Age of Onset  . Heart attack Mother   . Coronary artery disease Mother   . Lung cancer Father   . Diabetes Father   . Hypertension Father   . Cancer Brother   . Coronary artery disease Other        paternal side of family    Social History   Socioeconomic History  . Marital status: Married    Spouse name: Not on file  . Number of children: Not on file  . Years of education: Not on file  . Highest education level: Not on file  Occupational History  . Occupation: farmer  Tobacco Use  . Smoking status: Former Research scientist (life sciences)  . Smokeless tobacco: Current User    Types: Chew  Substance and Sexual Activity  . Alcohol use: Not on file  . Drug use: Not on file  . Sexual activity: Not on file  Other Topics Concern  . Not on file  Social History Narrative  . Not on file   Social Determinants of Health   Financial Resource Strain:   . Difficulty of Paying Living Expenses:   Food Insecurity:   . Worried About Charity fundraiser in the Last Year:   . Arboriculturist in the Last Year:   Transportation Needs:   . Film/video editor (Medical):   Marland Kitchen Lack of Transportation (Non-Medical):   Physical Activity:   .  Days of Exercise per Week:   . Minutes of Exercise per Session:   Stress:   . Feeling of Stress :   Social Connections:   . Frequency of Communication with Friends and Family:   . Frequency of Social Gatherings with Friends and Family:   . Attends Religious Services:   . Active Member of Clubs or Organizations:   . Attends Archivist Meetings:   Marland Kitchen Marital Status:   Intimate Partner Violence:   . Fear of Current or Ex-Partner:   . Emotionally Abused:   Marland Kitchen Physically Abused:   . Sexually  Abused:     Outpatient Medications Prior to Visit  Medication Sig Dispense Refill  . aspirin 81 MG tablet Take 81 mg by mouth daily.      . nitroGLYCERIN (NITROSTAT) 0.4 MG SL tablet Place 0.4 mg under the tongue every 5 (five) minutes as needed for chest pain.     Marland Kitchen amLODipine (NORVASC) 10 MG tablet TAKE 1 TABLET BY MOUTH ONCE DAILY 90 tablet 2  . finasteride (PROSCAR) 5 MG tablet TAKE 1 TABLET BY MOUTH  DAILY 90 tablet 2  . glimepiride (AMARYL) 4 MG tablet TAKE 1 TABLET BY MOUTH  DAILY 90 tablet 3  . lisinopril-hydrochlorothiazide (ZESTORETIC) 20-12.5 MG tablet TAKE 1 TABLET BY MOUTH  DAILY 90 tablet 2  . metFORMIN (GLUCOPHAGE) 500 MG tablet TAKE 1 TABLET BY MOUTH  TWICE DAILY 180 tablet 3  . omeprazole (PRILOSEC) 20 MG capsule Take 20 mg by mouth daily.    . simvastatin (ZOCOR) 40 MG tablet TAKE 1 TABLET BY MOUTH ONCE DAILY 90 tablet 2  . tamsulosin (FLOMAX) 0.4 MG CAPS capsule TAKE 1 CAPSULE BY MOUTH  ONCE DAILY 90 capsule 3   No facility-administered medications prior to visit.    No Known Allergies  ROS Review of Systems  Constitutional: Negative.   HENT: Negative.   Eyes: Negative.   Respiratory: Negative.   Cardiovascular: Negative.   Gastrointestinal: Negative.   Endocrine: Negative.   Genitourinary: Negative.   Musculoskeletal: Negative.   Skin: Negative.   Neurological: Negative.   Psychiatric/Behavioral: Negative.       Objective:    Physical Exam  Constitutional: He is oriented to person, place, and time. He appears well-developed and well-nourished.  HENT:  Head: Normocephalic and atraumatic.  Right Ear: External ear normal.  Left Ear: External ear normal.  Nose: Nose normal.  Mouth/Throat: Oropharynx is clear and moist.  Eyes: Pupils are equal, round, and reactive to light. Conjunctivae and EOM are normal.  Cardiovascular: Normal rate, regular rhythm, normal heart sounds and intact distal pulses.  Pulmonary/Chest: Effort normal and breath sounds  normal.  Abdominal: Soft. Bowel sounds are normal.  Musculoskeletal:        General: Normal range of motion.     Cervical back: Normal range of motion and neck supple.  Neurological: He is alert and oriented to person, place, and time. He has normal reflexes.  Skin: Skin is warm and dry.  Psychiatric: He has a normal mood and affect. His behavior is normal. Judgment and thought content normal.  Vitals reviewed.   BP 124/72   Pulse 64   Temp (!) 97.4 F (36.3 C)   Ht 5\' 3"  (1.6 m)   Wt 176 lb 9.6 oz (80.1 kg)   SpO2 98%   BMI 31.28 kg/m  Wt Readings from Last 3 Encounters:  10/14/19 176 lb 9.6 oz (80.1 kg)  06/17/19 178 lb 12.8 oz (81.1 kg)  01/25/19 178 lb 12.8 oz (81.1 kg)     Health Maintenance Due  Topic Date Due  . Hepatitis C Screening  Never done  . OPHTHALMOLOGY EXAM  Never done  . TETANUS/TDAP  Never done  . PNA vac Low Risk Adult (1 of 2 - PCV13) Never done    There are no preventive care reminders to display for this patient.  No results found for: TSH Lab Results  Component Value Date   WBC 7.2 06/17/2019   HGB 14.5 06/17/2019   HCT 43.1 06/17/2019   MCV 91 06/17/2019   PLT 223 06/17/2019   Lab Results  Component Value Date   NA 135 06/17/2019   K 5.0 06/17/2019   CO2 20 06/17/2019   GLUCOSE 121 (H) 06/17/2019   BUN 15 06/17/2019   CREATININE 1.02 06/17/2019   BILITOT 0.3 06/17/2019   ALKPHOS 46 06/17/2019   AST 10 06/17/2019   ALT 13 06/17/2019   PROT 7.2 06/17/2019   ALBUMIN 4.7 06/17/2019   CALCIUM 10.0 06/17/2019   Lab Results  Component Value Date   CHOL 132 06/17/2019   Lab Results  Component Value Date   HDL 58 06/17/2019   Lab Results  Component Value Date   LDLCALC 59 06/17/2019   Lab Results  Component Value Date   TRIG 73 06/17/2019   Lab Results  Component Value Date   CHOLHDL 2.3 06/17/2019   Lab Results  Component Value Date   HGBA1C 6.4 (H) 06/17/2019      Assessment & Plan:   Hypertension: An  individual hypertension care plan was established and reinforced today.  The patient's status was assessed using clinical findings on exam and labs or diagnostic tests. The patient's success at meeting treatment goals on disease specific evidence-based guidelines and found to be well controlled. SELF MANAGEMENT: The patient and I together assessed ways to personally work towards obtaining the recommended goals. RECOMMENDATIONS: avoid decongestants found in common cold remedies, decrease consumption of alcohol, perform routine monitoring of BP with home BP cuff, exercise, reduction of dietary salt, take medicines as prescribed, try not to miss doses and quit smoking.  Regular exercise and maintaining a healthy weight is needed.  Stress reduction may help. A CLINICAL SUMMARY including written plan identify barriers to care unique to individual due to social or financial issues.  We attempt to mutually creat solutions for individual and family understanding.  CAD: An individual plan was formulated based on patient history and exam, labs and evidence based data. Patient has not had recent angina or nitroglycerin use. conti nue present treatment.  GERD: Plan of care was formulated today.  She is doing well.  A plan of care was formulated using patient exam, tests and other sources to optimize care using evidence based information.  Recommend no smoking, no eating after supper, avoid fatty foods, elevate Head of bed, avoid tight fitting clothing.  Continue on omeprazole.  Diabetes: An individual care plan for diabetes was established and reinforced today.  The patient's status was assessed using clinical findings on exam, labs and diagnostic testing. Patient success at meeting goals based on disease specific evidence-based guidelines and found to be good controlled. Medications were assessed and patient's understanding of the medical issues , including barriers were assessed. Recommend adherence to a  diabetic diet, a graduated exercise program, HgbA1c level is checked quarterly, and urine microalbumin performed yearly .  Annual mono-filament sensation testing performed. Lower blood pressure and control hyperlipidemia is important. Get annual  eye exams and annual flu shots and smoking cessation discussed.  Self management goals were discussed.  BPH: He is stable on his medicines.  Hyperlipidemia: AN INDIVIDUAL CARE PLAN for hyperlipidemia/ cholesterol was established and reinforced today.  The patient's status was assessed using clinical findings on exam, lab and other diagnostic tests. The patient's disease status was assessed based on evidence-based guidelines and found to be well controlled. MEDICATIONS were reviewed. SELF MANAGEMENT GOALS have been discussed and patient's success at attaining the goal of low cholesterol was assessed. RECOMMENDATION given include regular exercise 3 days a week and low cholesterol/low fat diet. CLINICAL SUMMARY including written plan to identify barriers unique to the patient due to social or economic  reasons was discussed.  BMI 31: An individualize plan was formulated for obesity using patient history and physical exam to encourage weight loss.  An evidence based program was formulated.  Patient is to cut portion size with meals and to plan physical exercise 3 days a week at least 20 minutes.  Weight watchers and other programs are helpful.  Planned amount of weight loss 10 lbs. Meds ordered this encounter  Medications  . amLODipine (NORVASC) 10 MG tablet    Sig: Take 1 tablet (10 mg total) by mouth daily.    Dispense:  90 tablet    Refill:  2    Requesting 1 year supply  . finasteride (PROSCAR) 5 MG tablet    Sig: Take 1 tablet (5 mg total) by mouth daily.    Dispense:  90 tablet    Refill:  2    Requesting 1 year supply  . glimepiride (AMARYL) 4 MG tablet    Sig: Take 1 tablet (4 mg total) by mouth daily.    Dispense:  90 tablet    Refill:  2     Requesting 1 year supply  . lisinopril-hydrochlorothiazide (ZESTORETIC) 20-12.5 MG tablet    Sig: Take 1 tablet by mouth daily.    Dispense:  90 tablet    Refill:  2    Requesting 1 year supply  . metFORMIN (GLUCOPHAGE) 500 MG tablet    Sig: Take 1 tablet (500 mg total) by mouth 2 (two) times daily.    Dispense:  180 tablet    Refill:  2    Requesting 1 year supply  . omeprazole (PRILOSEC) 20 MG capsule    Sig: Take 1 capsule (20 mg total) by mouth daily.    Dispense:  90 capsule    Refill:  2  . simvastatin (ZOCOR) 40 MG tablet    Sig: Take 1 tablet (40 mg total) by mouth daily.    Dispense:  90 tablet    Refill:  2    Requesting 1 year supply  . tamsulosin (FLOMAX) 0.4 MG CAPS capsule    Sig: Take 1 capsule (0.4 mg total) by mouth daily.    Dispense:  90 capsule    Refill:  2    Requesting 1 year supply    Follow-up: Return in about 4 months (around 02/13/2020) for fasting.    Reinaldo Meeker, MD

## 2019-10-15 LAB — COMPREHENSIVE METABOLIC PANEL
ALT: 11 IU/L (ref 0–44)
AST: 10 IU/L (ref 0–40)
Albumin/Globulin Ratio: 1.9 (ref 1.2–2.2)
Albumin: 4.6 g/dL (ref 3.7–4.7)
Alkaline Phosphatase: 46 IU/L — ABNORMAL LOW (ref 48–121)
BUN/Creatinine Ratio: 15 (ref 10–24)
BUN: 16 mg/dL (ref 8–27)
Bilirubin Total: 0.3 mg/dL (ref 0.0–1.2)
CO2: 24 mmol/L (ref 20–29)
Calcium: 9.7 mg/dL (ref 8.6–10.2)
Chloride: 98 mmol/L (ref 96–106)
Creatinine, Ser: 1.07 mg/dL (ref 0.76–1.27)
GFR calc Af Amer: 80 mL/min/{1.73_m2} (ref >59)
GFR calc non Af Amer: 69 mL/min/{1.73_m2} (ref >59)
Globulin, Total: 2.4 g/dL (ref 1.5–4.5)
Glucose: 118 mg/dL — ABNORMAL HIGH (ref 65–99)
Potassium: 4.5 mmol/L (ref 3.5–5.2)
Sodium: 136 mmol/L (ref 134–144)
Total Protein: 7 g/dL (ref 6.0–8.5)

## 2019-10-15 LAB — CBC WITH DIFFERENTIAL/PLATELET
Basophils Absolute: 0.1 10*3/uL (ref 0.0–0.2)
Basos: 1 %
EOS (ABSOLUTE): 0.2 10*3/uL (ref 0.0–0.4)
Eos: 3 %
Hematocrit: 43.6 % (ref 37.5–51.0)
Hemoglobin: 13.9 g/dL (ref 13.0–17.7)
Immature Grans (Abs): 0 10*3/uL (ref 0.0–0.1)
Immature Granulocytes: 0 %
Lymphocytes Absolute: 1.9 10*3/uL (ref 0.7–3.1)
Lymphs: 25 %
MCH: 29.4 pg (ref 26.6–33.0)
MCHC: 31.9 g/dL (ref 31.5–35.7)
MCV: 92 fL (ref 79–97)
Monocytes Absolute: 1 10*3/uL — ABNORMAL HIGH (ref 0.1–0.9)
Monocytes: 13 %
Neutrophils Absolute: 4.5 10*3/uL (ref 1.4–7.0)
Neutrophils: 58 %
Platelets: 228 10*3/uL (ref 150–450)
RBC: 4.72 x10E6/uL (ref 4.14–5.80)
RDW: 13.1 % (ref 11.6–15.4)
WBC: 7.7 10*3/uL (ref 3.4–10.8)

## 2019-10-15 LAB — LIPID PANEL
Chol/HDL Ratio: 2.5 ratio (ref 0.0–5.0)
Cholesterol, Total: 137 mg/dL (ref 100–199)
HDL: 54 mg/dL (ref >39)
LDL Chol Calc (NIH): 68 mg/dL (ref 0–99)
Triglycerides: 73 mg/dL (ref 0–149)
VLDL Cholesterol Cal: 15 mg/dL (ref 5–40)

## 2019-10-15 LAB — HEMOGLOBIN A1C
Est. average glucose Bld gHb Est-mCnc: 137 mg/dL
Hgb A1c MFr Bld: 6.4 % — ABNORMAL HIGH (ref 4.8–5.6)

## 2019-10-15 LAB — CARDIOVASCULAR RISK ASSESSMENT

## 2019-10-16 NOTE — Progress Notes (Signed)
CBC good, glucose 118, kidney tests ok, liver tests OK, cholesterol, A1c 6.4 lp

## 2019-11-07 ENCOUNTER — Other Ambulatory Visit: Payer: Self-pay

## 2019-11-07 MED ORDER — NITROGLYCERIN 0.4 MG SL SUBL: 0.4000 mg | SUBLINGUAL_TABLET | SUBLINGUAL | 3 refills | Status: AC | PRN

## 2019-12-16 DIAGNOSIS — E119 Type 2 diabetes mellitus without complications: Secondary | ICD-10-CM | POA: Diagnosis not present

## 2020-01-04 DIAGNOSIS — E119 Type 2 diabetes mellitus without complications: Secondary | ICD-10-CM | POA: Diagnosis not present

## 2020-01-04 DIAGNOSIS — Z7984 Long term (current) use of oral hypoglycemic drugs: Secondary | ICD-10-CM | POA: Diagnosis not present

## 2020-01-04 DIAGNOSIS — H2513 Age-related nuclear cataract, bilateral: Secondary | ICD-10-CM | POA: Diagnosis not present

## 2020-01-04 LAB — HM DIABETES EYE EXAM

## 2020-01-11 ENCOUNTER — Other Ambulatory Visit: Payer: Self-pay | Admitting: Legal Medicine

## 2020-01-25 ENCOUNTER — Other Ambulatory Visit: Payer: Self-pay

## 2020-01-25 ENCOUNTER — Encounter: Payer: Self-pay | Admitting: Cardiovascular Disease

## 2020-01-25 ENCOUNTER — Ambulatory Visit: Payer: Medicare Other | Admitting: Cardiovascular Disease

## 2020-01-25 VITALS — BP 128/68 | HR 68 | Ht 64.0 in | Wt 176.6 lb

## 2020-01-25 DIAGNOSIS — I2510 Atherosclerotic heart disease of native coronary artery without angina pectoris: Secondary | ICD-10-CM | POA: Diagnosis not present

## 2020-01-25 DIAGNOSIS — I10 Essential (primary) hypertension: Secondary | ICD-10-CM | POA: Diagnosis not present

## 2020-01-25 NOTE — Patient Instructions (Signed)
Medication Instructions:  Your provider recommends that you continue on your current medications as directed. Please refer to the Current Medication list given to you today.   *If you need a refill on your cardiac medications before your next appointment, please call your pharmacy*  Lab Work: Please have fasting lab work prior to your next appointment with Dr. Harriet Masson.  If you have labs (blood work) drawn today and your tests are completely normal, you will receive your results only by: Marland Kitchen MyChart Message (if you have MyChart) OR . A paper copy in the mail If you have any lab test that is abnormal or we need to change your treatment, we will call you to review the results.  Follow-Up: At [facility], you and your health needs are our priority.  As part of our continuing mission to provide you with exceptional heart care, we have created designated Provider Care Teams.  These Care Teams include your primary Cardiologist (physician) and Advanced Practice Providers (APPs -  Physician Assistants and Nurse Practitioners) who all work together to provide you with the care you need, when you need it. Your next appointment:   12 month(s) The format for your next appointment:   In Person Provider:    Dr. Harriet Masson  [address], [place], [place] [address]

## 2020-01-25 NOTE — Progress Notes (Signed)
Cardiology Office Note   Date:  01/25/2020   ID:  [name], [name] Aug 01, 1945, MRN [id number]  PCP:  Lillard Anes, MD  Cardiologist:   Mertie Moores, MD  ( previoiusly [facility])   Chief Complaint  Patient presents with  . Coronary Artery Disease  . Hyperlipidemia   Problem list 1. Coronary artery disease 2. Hypertension 3. Hyperlipidemia     Lance Riddle is a 74 y.o. male who presents for his CAD by cath in 1994.  Previous patient of [facility]ochrein.    Has been having some sharp pain .   Not related to exercise.  The pains are not related to eating or drinking, twisting or turning. Not related to taking a deep breath. Last a split second Walks 2 miles every am - no CP with this. Does have some leg cramps  Also has leg cramps at night .   Aug. 20, 2018:  Lance Riddle is a 74 yo with hx of mild, nonobstructive CAD. Rough year, lost his wife this past June.   Married 44 years.  Has been active.  Works in the garden, [place] .    No CP or dyspnea.   Labs from primary MD shows : Chol = 136 Trigs  = 68 HDL = 65 LDL = 57  January 04, 2018:  Lance Riddle is seen today for follow-up visit of his hypertension and hyperlipidemia.  He has a history of mild, nonobstructive coronary artery disease. Grew a garden again this year. Lots of veggies  Had labs with his primary MD earlier this month   Sept. 15, 2020  [name]  Is  seen today for follow-up of his hypertension, hyperlipidemia mild coronary artery disease. Active Works in the garden .  Had a large garden .  66 tomato plants Walks  No CP or dyspnea.    Labs from his primary medical doctor from June reveals a total cholesterol of 127.  The HDL is 58.  The LDL is 57.  The triglyceride level is 60.  Hemoglobin A1c is 6.7.  Hemoglobin is 13.9.  Liver enzymes are normal.  Potassium is 4.7.  Sept. 15, 2021: Lance Riddle is seen today for follow-up of his coronary artery disease.  He has a history of type 2 diabetes  mellitus, hyperlipidemia, hypertension.  No CP or dyspnea.  Had a large garden this year .   Uses his [address] for his gardent .  gorws tomatoes, watermelon, squash, okra, sun flowers. Is active in his garden.   Not as much cardio exercise recently  We have discussed having him resume his walking in addition to his garden worik.    Past Medical History:  Diagnosis Date  . Acute myocardial infarction, unspecified site, episode of care unspecified   . Coronary atherosclerosis of native coronary artery    2007 catheterization D1 70%, ramus intermediate 40%, large RV branch off the right coronary artery 60-70%.; Lexiscan Myoview (03/2013):  No ischemia, EF 58% (normal study).  . Other and unspecified hyperlipidemia   . Type II or unspecified type diabetes mellitus without mention of complication, not stated as uncontrolled    type 2  . Unspecified essential hypertension     Past Surgical History:  Procedure Laterality Date  . ANGIOPLASTY  1994  . CARDIAC SURGERY    . TONSILLECTOMY AND ADENOIDECTOMY       Current Outpatient Medications  Medication Sig Dispense Refill  . amLODipine (NORVASC) 10 MG tablet Take 1 tablet (10  mg total) by mouth daily. 90 tablet 2  . aspirin 81 MG tablet Take 81 mg by mouth daily.      . finasteride (PROSCAR) 5 MG tablet Take 1 tablet (5 mg total) by mouth daily. 90 tablet 2  . glimepiride (AMARYL) 4 MG tablet Take 1 tablet (4 mg total) by mouth daily. 90 tablet 2  . lisinopril-hydrochlorothiazide (ZESTORETIC) 20-12.5 MG tablet Take 1 tablet by mouth daily. 90 tablet 2  . metFORMIN (GLUCOPHAGE) 500 MG tablet Take 1 tablet (500 mg total) by mouth 2 (two) times daily. 180 tablet 2  . nitroGLYCERIN (NITROSTAT) 0.4 MG SL tablet Place 1 tablet (0.4 mg total) under the tongue every 5 (five) minutes as needed for chest pain. 25 tablet 3  . omeprazole (PRILOSEC) 20 MG capsule Take 1 capsule (20 mg total) by mouth daily. 90 capsule 2  . simvastatin (ZOCOR) 40 MG  tablet Take 1 tablet (40 mg total) by mouth daily. 90 tablet 2  . tamsulosin (FLOMAX) 0.4 MG CAPS capsule Take 1 capsule (0.4 mg total) by mouth daily. 90 capsule 2   No current facility-administered medications for this visit.    Allergies:   Patient has no known allergies.    Social History:  The patient  reports that he has quit smoking. His smokeless tobacco use includes chew.   Family History:  The patient's family history includes Cancer in his brother; Coronary artery disease in his mother and another family member; Diabetes in his father; Heart attack in his mother; Hypertension in his father; Lung cancer in his father.    ROS: Noted in current history, otherwise review of systems is negative.  Physical Exam: Blood pressure 128/68, pulse 68, height 5\' 4"  (1.626 m), weight 176 lb 9.6 oz (80.1 kg), SpO2 97 %.  GEN:   Elderly , pleasant male,  NAD  HEENT: Normal NECK: No JVD; No carotid bruits LYMPHATICS: No lymphadenopathy CARDIAC: RRR , no murmurs, rubs, gallops RESPIRATORY:  Clear to auscultation without rales, wheezing or rhonchi  ABDOMEN: Soft, non-tender, non-distended MUSCULOSKELETAL:  No edema; No deformity  SKIN: Warm and dry NEUROLOGIC:  Alert and oriented x 3    EKG:     Spet.   15, 2021:  NSR at 71,  NS ST / T wave abn.     Recent Labs: 10/14/2019: ALT 11; BUN 16; Creatinine, Ser 1.07; Hemoglobin 13.9; Platelets 228; Potassium 4.5; Sodium 136    Lipid Panel    Component Value Date/Time   CHOL 137 10/14/2019 0810   TRIG 73 10/14/2019 0810   HDL 54 10/14/2019 0810   CHOLHDL 2.5 10/14/2019 0810   CHOLHDL 1.8 11/26/2015 1511   VLDL 12 11/26/2015 1511   LDLCALC 68 10/14/2019 0810      Wt Readings from Last 3 Encounters:  01/25/20 176 lb 9.6 oz (80.1 kg)  10/14/19 176 lb 9.6 oz (80.1 kg)  06/17/19 178 lb 12.8 oz (81.1 kg)      Other studies Reviewed: Additional studies/ records that were reviewed today include: . Review of the above records  demonstrates:    ASSESSMENT AND PLAN:  1.  CAD : .  No angina.   Continue current meds.  Encouraged him to walk more.   He remains very busy with his garden     2. Essential hypertension:  BP is well controlled .  Cont meds     3. Hyperlipidemia: Labs from October 14, 2019 were reviewed.  His total cholesterol is 137.  The  HDL is 54.  Triglyceride level 73.  The LDL is 68.  Continue current medications.    Current medicines are reviewed at length with the patient today.  The patient does not have concerns regarding medicines.  Labs/ tests ordered today include:   No orders of the defined types were placed in this encounter.   The patient lives in [place].  We will arrange for him to see Dr. Graylon Good  in our [place] office in 1 year.   Mertie Moores, MD  01/25/2020 8:58 AM    [address], [place], [place]  [address] Phone: [phone]; Fax: [phone]

## 2020-02-10 ENCOUNTER — Ambulatory Visit: Payer: Medicare Other | Admitting: Legal Medicine

## 2020-02-17 ENCOUNTER — Ambulatory Visit (INDEPENDENT_AMBULATORY_CARE_PROVIDER_SITE_OTHER): Payer: Medicare Other | Admitting: Legal Medicine

## 2020-02-17 ENCOUNTER — Other Ambulatory Visit: Payer: Self-pay

## 2020-02-17 ENCOUNTER — Encounter: Payer: Self-pay | Admitting: Legal Medicine

## 2020-02-17 VITALS — BP 102/60 | HR 74 | Temp 97.6°F | Resp 16 | Ht 64.0 in | Wt 177.0 lb

## 2020-02-17 DIAGNOSIS — K2100 Gastro-esophageal reflux disease with esophagitis, without bleeding: Secondary | ICD-10-CM

## 2020-02-17 DIAGNOSIS — I152 Hypertension secondary to endocrine disorders: Secondary | ICD-10-CM

## 2020-02-17 DIAGNOSIS — E1159 Type 2 diabetes mellitus with other circulatory complications: Secondary | ICD-10-CM | POA: Diagnosis not present

## 2020-02-17 DIAGNOSIS — N401 Enlarged prostate with lower urinary tract symptoms: Secondary | ICD-10-CM

## 2020-02-17 DIAGNOSIS — E1169 Type 2 diabetes mellitus with other specified complication: Secondary | ICD-10-CM

## 2020-02-17 DIAGNOSIS — E782 Mixed hyperlipidemia: Secondary | ICD-10-CM

## 2020-02-17 DIAGNOSIS — I10 Essential (primary) hypertension: Secondary | ICD-10-CM

## 2020-02-17 DIAGNOSIS — E669 Obesity, unspecified: Secondary | ICD-10-CM

## 2020-02-17 DIAGNOSIS — N138 Other obstructive and reflux uropathy: Secondary | ICD-10-CM

## 2020-02-17 DIAGNOSIS — Z23 Encounter for immunization: Secondary | ICD-10-CM | POA: Diagnosis not present

## 2020-02-17 NOTE — Progress Notes (Signed)
Subjective:  Patient ID: Lance Riddle, male    DOB: 1946/01/09  Age: 74 y.o. MRN: [id number]  Chief Complaint  Patient presents with  . Hyperlipidemia  . Hypertension    HPI: chronic  Patient present with type 2 diabetes.  Specifically, this is type 2, noninsulin requiring diabetes, complicated by hyertension and hypercholesterolemia.  Compliance with treatment has been good; patient take medicines as directed, maintains diet and exercise regimen, follows up as directed, and is keeping glucose diary.  Date of  diagnosis 2010.  Depression screen has been performed.Tobacco screen nonsmker. Current medicines for diabetes meformin and amaryl.  Patient is on lisinopril for renal protection and simvastatin for cholesterol control.  Patient performs foot exams daily and last ophthalmologic exam was yes  Patient presents for follow up of hypertension.  Patient tolerating amlodipine, lisinopril/HCTZ well with side effects.  Patient was diagnosed with hypertension 2010 so has been treated for hypertension for 10 years.Patient is working on maintaining diet and exercise regimen and follows up as directed. Complication include simvastatin  Patient presents for follow up of hypertension.  Patient tolerating lisinopril/HCTZ well with side effects.  Patient was diagnosed with hypertension 201010 so has been treated for hypertension for 10 years.Patient is working on maintaining diet and exercise regimen and follows up as directed. Complication include none. Current Outpatient Medications on File Prior to Visit  Medication Sig Dispense Refill  . amLODipine (NORVASC) 10 MG tablet Take 1 tablet (10 mg total) by mouth daily. 90 tablet 2  . aspirin 81 MG tablet Take 81 mg by mouth daily.      . finasteride (PROSCAR) 5 MG tablet Take 1 tablet (5 mg total) by mouth daily. 90 tablet 2  . glimepiride (AMARYL) 4 MG tablet Take 1 tablet (4 mg total) by mouth daily. 90 tablet 2  . lisinopril-hydrochlorothiazide  (ZESTORETIC) 20-12.5 MG tablet Take 1 tablet by mouth daily. 90 tablet 2  . metFORMIN (GLUCOPHAGE) 500 MG tablet Take 1 tablet (500 mg total) by mouth 2 (two) times daily. 180 tablet 2  . nitroGLYCERIN (NITROSTAT) 0.4 MG SL tablet Place 1 tablet (0.4 mg total) under the tongue every 5 (five) minutes as needed for chest pain. 25 tablet 3  . omeprazole (PRILOSEC) 20 MG capsule Take 1 capsule (20 mg total) by mouth daily. 90 capsule 2  . simvastatin (ZOCOR) 40 MG tablet Take 1 tablet (40 mg total) by mouth daily. 90 tablet 2  . tamsulosin (FLOMAX) 0.4 MG CAPS capsule Take 1 capsule (0.4 mg total) by mouth daily. 90 capsule 2   No current facility-administered medications on file prior to visit.   Past Medical History:  Diagnosis Date  . Acute myocardial infarction, unspecified site, episode of care unspecified   . Coronary atherosclerosis of native coronary artery    2007 catheterization D1 70%, ramus intermediate 40%, large RV branch off the right coronary artery 60-70%.; Lexiscan Myoview (03/2013):  No ischemia, EF 58% (normal study).  . Other and unspecified hyperlipidemia   . Unspecified essential hypertension    Past Surgical History:  Procedure Laterality Date  . ANGIOPLASTY  1994  . CARDIAC SURGERY    . TONSILLECTOMY AND ADENOIDECTOMY      Family History  Problem Relation Age of Onset  . Heart attack Mother   . Coronary artery disease Mother   . Lung cancer Father   . Diabetes Father   . Hypertension Father   . Cancer Brother   . Coronary artery disease Other  paternal side of family   Social History   Socioeconomic History  . Marital status: Married    Spouse name: Not on file  . Number of children: 0  . Years of education: Not on file  . Highest education level: Not on file  Occupational History  . Occupation: farmer  Tobacco Use  . Smoking status: Former Smoker    Types: Cigarettes    Quit date: 1994    Years since quitting: 27.7  . Smokeless tobacco:  Current User    Types: Chew  Substance and Sexual Activity  . Alcohol use: Not Currently  . Drug use: Not Currently  . Sexual activity: Not Currently  Other Topics Concern  . Not on file  Social History Narrative  . Not on file   Social Determinants of Health   Financial Resource Strain:   . Difficulty of Paying Living Expenses: Not on file  Food Insecurity:   . Worried About Charity fundraiser in the Last Year: Not on file  . Ran Out of Food in the Last Year: Not on file  Transportation Needs:   . Lack of Transportation (Medical): Not on file  . Lack of Transportation (Non-Medical): Not on file  Physical Activity:   . Days of Exercise per Week: Not on file  . Minutes of Exercise per Session: Not on file  Stress:   . Feeling of Stress : Not on file  Social Connections:   . Frequency of Communication with Friends and Family: Not on file  . Frequency of Social Gatherings with Friends and Family: Not on file  . Attends Religious Services: Not on file  . Active Member of Clubs or Organizations: Not on file  . Attends Archivist Meetings: Not on file  . Marital Status: Not on file    Review of Systems  Constitutional: Negative.   HENT: Negative.   Eyes: Negative.   Respiratory: Positive for shortness of breath.   Cardiovascular: Negative for chest pain, palpitations and leg swelling.  Gastrointestinal: Negative.   Endocrine: Negative.   Genitourinary: Negative for difficulty urinating and dysuria.  Musculoskeletal: Negative.   Skin: Negative.   Neurological: Negative.   Psychiatric/Behavioral: Negative.      Objective:  BP 102/60   Pulse 74   Temp 97.6 F (36.4 C)   Resp 16   Ht 5\' 4"  (1.626 m)   Wt 177 lb (80.3 kg)   BMI 30.38 kg/m   BP/Weight 02/17/2020 3/61/4431 09/12/84  Systolic BP 761 950 932  Diastolic BP 60 68 72  Wt. (Lbs) 177 176.6 176.6  BMI 30.38 30.31 31.28    Physical Exam Constitutional:      Appearance: Normal appearance.    HENT:     Head: Normocephalic and atraumatic.     Right Ear: Tympanic membrane normal.     Left Ear: Tympanic membrane normal.     Nose: Nose normal.     Mouth/Throat:     Mouth: Mucous membranes are moist.     Pharynx: Oropharynx is clear.  Eyes:     Extraocular Movements: Extraocular movements intact.     Conjunctiva/sclera: Conjunctivae normal.     Pupils: Pupils are equal, round, and reactive to light.  Cardiovascular:     Rate and Rhythm: Normal rate and regular rhythm.     Pulses: Normal pulses.     Heart sounds: Normal heart sounds.  Pulmonary:     Effort: Pulmonary effort is normal.  Abdominal:  General: Abdomen is flat.     Palpations: Abdomen is soft.  Musculoskeletal:        General: Normal range of motion.     Cervical back: Normal range of motion.  Skin:    General: Skin is warm.     Capillary Refill: Capillary refill takes less than 2 seconds.  Neurological:     General: No focal deficit present.     Mental Status: He is alert and oriented to person, place, and time. Mental status is at baseline.     Diabetic Foot Exam - Simple   Simple Foot Form Diabetic Foot exam was performed with the following findings: Yes 02/17/2020  9:13 AM  Visual Inspection No deformities, no ulcerations, no other skin breakdown bilaterally: Yes Sensation Testing Intact to touch and monofilament testing bilaterally: Yes Pulse Check Posterior Tibialis and Dorsalis pulse intact bilaterally: Yes Comments      Lab Results  Component Value Date   WBC 8.7 02/17/2020   HGB 14.5 02/17/2020   HCT 42.7 02/17/2020   PLT 232 02/17/2020   GLUCOSE 132 (H) 02/17/2020   CHOL 140 02/17/2020   TRIG 81 02/17/2020   HDL 61 02/17/2020   LDLCALC 63 02/17/2020   ALT 16 02/17/2020   AST 10 02/17/2020   NA 130 (L) 02/17/2020   K 5.1 02/17/2020   CL 93 (L) 02/17/2020   CREATININE 0.99 02/17/2020   BUN 11 02/17/2020   CO2 22 02/17/2020   TSH 1.030 02/17/2020   HGBA1C 6.6 (H)  02/17/2020   MICROALBUR 30 06/17/2019      Assessment & Plan:   1. Combined hyperlipidemia associated with type 2 diabetes mellitus ([place]) An individual care plan for diabetes was established and reinforced today.  The patient's status was assessed using clinical findings on exam, labs and diagnostic testing. Patient success at meeting goals based on disease specific evidence-based guidelines and found to be good controlled. Medications were assessed and patient's understanding of the medical issues , including barriers were assessed. Recommend adherence to a diabetic diet, a graduated exercise program, HgbA1c level is checked quarterly, and urine microalbumin performed yearly .  Annual mono-filament sensation testing performed. Lower blood pressure and control hyperlipidemia is important. Get annual eye exams and annual flu shots and smoking cessation discussed.  Self management goals were discussed.  2. Mixed hyperlipidemia - Lipid panel - TSH AN INDIVIDUAL CARE PLAN for hyperlipidemia/ cholesterol was established and reinforced today.  The patient's status was assessed using clinical findings on exam, lab and other diagnostic tests. The patient's disease status was assessed based on evidence-based guidelines and found to be well controlled. MEDICATIONS were reviewed. SELF MANAGEMENT GOALS have been discussed and patient's success at attaining the goal of low cholesterol was assessed. RECOMMENDATION given include regular exercise 3 days a week and low cholesterol/low fat diet. CLINICAL SUMMARY including written plan to identify barriers unique to the patient due to social or economic  reasons was discussed.  3. Essential hypertension - Comprehensive metabolic panel - CBC with Differential/Platelet An individual hypertension care plan was established and reinforced today.  The patient's status was assessed using clinical findings on exam and labs or diagnostic tests. The patient's success at  meeting treatment goals on disease specific evidence-based guidelines and found to be well controlled. SELF MANAGEMENT: The patient and I together assessed ways to personally work towards obtaining the recommended goals. RECOMMENDATIONS: avoid decongestants found in common cold remedies, decrease consumption of alcohol, perform routine monitoring of BP with  home BP cuff, exercise, reduction of dietary salt, take medicines as prescribed, try not to miss doses and quit smoking.  Regular exercise and maintaining a healthy weight is needed.  Stress reduction may help. A CLINICAL SUMMARY including written plan identify barriers to care unique to individual due to social or financial issues.  We attempt to mutually creat solutions for individual and family understanding.  4. Enlarged prostate with urinary obstruction - PSA AN INDIVIDUAL CARE PLAN BPH was established and reinforced today.  The patient's status was assessed using clinical findings on exam, labs, and other diagnostic testing. Patient's success at meeting treatment goals based on disease specific evidence-bassed guidelines and found to be in good control. RECOMMENDATIONS include maintaining present medicines and treatment.  5. Obesity, diabetes, and hypertension syndrome ([facility]) - Hemoglobin A1c An individual care plan for diabetes was established and reinforced today.  The patient's status was assessed using clinical findings on exam, labs and diagnostic testing. Patient success at meeting goals based on disease specific evidence-based guidelines and found to be good controlled. Medications were assessed and patient's understanding of the medical issues , including barriers were assessed. Recommend adherence to a diabetic diet, a graduated exercise program, HgbA1c level is checked quarterly, and urine microalbumin performed yearly .  Annual mono-filament sensation testing performed. Lower blood pressure and control hyperlipidemia is important.  Get annual eye exams and annual flu shots and smoking cessation discussed.  Self management goals were discussed.  6. Gastroesophageal reflux disease with esophagitis without hemorrhage Plan of care was formulated today.  She is doing well.  A plan of care was formulated using patient exam, tests and other sources to optimize care using evidence based information.  Recommend no smoking, no eating after supper, avoid fatty foods, elevate Head of bed, avoid tight fitting clothing.  Continue on omeprazole.    No orders of the defined types were placed in this encounter.   Orders Placed This Encounter  Procedures  . Flu Vaccine QUAD High Dose(Fluad)  . Comprehensive metabolic panel  . Hemoglobin A1c  . Lipid panel  . TSH  . PSA  . CBC with Differential/Platelet  . Cardiovascular Risk Assessment     Follow-up: Return in about 4 months (around 06/19/2020) for fastin, mcr PE.  An After Visit Summary was printed and given to the patient.  [place] [phone]

## 2020-02-18 LAB — COMPREHENSIVE METABOLIC PANEL
ALT: 16 IU/L (ref 0–44)
AST: 10 IU/L (ref 0–40)
Albumin/Globulin Ratio: 2.1 (ref 1.2–2.2)
Albumin: 4.8 g/dL — ABNORMAL HIGH (ref 3.7–4.7)
Alkaline Phosphatase: 45 IU/L (ref 44–121)
BUN/Creatinine Ratio: 11 (ref 10–24)
BUN: 11 mg/dL (ref 8–27)
Bilirubin Total: 0.3 mg/dL (ref 0.0–1.2)
CO2: 22 mmol/L (ref 20–29)
Calcium: 9.8 mg/dL (ref 8.6–10.2)
Chloride: 93 mmol/L — ABNORMAL LOW (ref 96–106)
Creatinine, Ser: 0.99 mg/dL (ref 0.76–1.27)
GFR calc Af Amer: 88 mL/min/{1.73_m2} (ref >59)
GFR calc non Af Amer: 76 mL/min/{1.73_m2} (ref >59)
Globulin, Total: 2.3 g/dL (ref 1.5–4.5)
Glucose: 132 mg/dL — ABNORMAL HIGH (ref 65–99)
Potassium: 5.1 mmol/L (ref 3.5–5.2)
Sodium: 130 mmol/L — ABNORMAL LOW (ref 134–144)
Total Protein: 7.1 g/dL (ref 6.0–8.5)

## 2020-02-18 LAB — LIPID PANEL
Chol/HDL Ratio: 2.3 ratio (ref 0.0–5.0)
Cholesterol, Total: 140 mg/dL (ref 100–199)
HDL: 61 mg/dL (ref >39)
LDL Chol Calc (NIH): 63 mg/dL (ref 0–99)
Triglycerides: 81 mg/dL (ref 0–149)
VLDL Cholesterol Cal: 16 mg/dL (ref 5–40)

## 2020-02-18 LAB — CBC WITH DIFFERENTIAL/PLATELET
Basophils Absolute: 0 10*3/uL (ref 0.0–0.2)
Basos: 0 %
EOS (ABSOLUTE): 0.2 10*3/uL (ref 0.0–0.4)
Eos: 2 %
Hematocrit: 42.7 % (ref 37.5–51.0)
Hemoglobin: 14.5 g/dL (ref 13.0–17.7)
Immature Grans (Abs): 0 10*3/uL (ref 0.0–0.1)
Immature Granulocytes: 0 %
Lymphocytes Absolute: 1.9 10*3/uL (ref 0.7–3.1)
Lymphs: 22 %
MCH: 30.1 pg (ref 26.6–33.0)
MCHC: 34 g/dL (ref 31.5–35.7)
MCV: 89 fL (ref 79–97)
Monocytes Absolute: 1.1 10*3/uL — ABNORMAL HIGH (ref 0.1–0.9)
Monocytes: 12 %
Neutrophils Absolute: 5.6 10*3/uL (ref 1.4–7.0)
Neutrophils: 64 %
Platelets: 232 10*3/uL (ref 150–450)
RBC: 4.82 x10E6/uL (ref 4.14–5.80)
RDW: 13.3 % (ref 11.6–15.4)
WBC: 8.7 10*3/uL (ref 3.4–10.8)

## 2020-02-18 LAB — HEMOGLOBIN A1C
Est. average glucose Bld gHb Est-mCnc: 143 mg/dL
Hgb A1c MFr Bld: 6.6 % — ABNORMAL HIGH (ref 4.8–5.6)

## 2020-02-18 LAB — TSH: TSH: 1.03 u[IU]/mL (ref 0.450–4.500)

## 2020-02-18 LAB — CARDIOVASCULAR RISK ASSESSMENT

## 2020-02-18 LAB — PSA: Prostate Specific Ag, Serum: 0.4 ng/mL (ref 0.0–4.0)

## 2020-02-19 NOTE — Progress Notes (Signed)
Glucose 132 high, kidney and liver tests normal, A1c 6.6, Cholesterol normal, TSH 1.030 normal, PSA 0.4 normal, CBC normal lp

## 2020-03-20 DIAGNOSIS — E119 Type 2 diabetes mellitus without complications: Secondary | ICD-10-CM | POA: Diagnosis not present

## 2020-03-22 ENCOUNTER — Ambulatory Visit (INDEPENDENT_AMBULATORY_CARE_PROVIDER_SITE_OTHER): Payer: Medicare Other | Admitting: Legal Medicine

## 2020-03-22 ENCOUNTER — Encounter: Payer: Self-pay | Admitting: Legal Medicine

## 2020-03-22 ENCOUNTER — Other Ambulatory Visit: Payer: Self-pay

## 2020-03-22 DIAGNOSIS — Z0000 Encounter for general adult medical examination without abnormal findings: Secondary | ICD-10-CM

## 2020-03-22 HISTORY — DX: Encounter for general adult medical examination without abnormal findings: Z00.00

## 2020-03-22 NOTE — Patient Instructions (Signed)
  [name] , Thank you for taking time to come for your Medicare Wellness Visit. I appreciate your ongoing commitment to your health goals. Please review the following plan we discussed and let me know if I can assist you in the future.   These are the goals we discussed: Goals    . Activity and Exercise Increased     Evidence-based guidance:  Review current exercise levels.  Assess patient perspective on exercise or activity level, barriers to increasing activity, motivation and readiness for change.  Recommend or set healthy exercise goal based on individual tolerance.  Encourage small steps toward making change in amount of exercise or activity.  Urge reduction of sedentary activities or screen time.  Promote group activities within the community or with family or support person.  Consider referral to rehabiliation therapist for assessment and exercise/activity plan.   Notes:        This is a list of the screening recommended for you and due dates:  Health Maintenance  Topic Date Due  .  Hepatitis C: One time screening is recommended by Center for Disease Control  (CDC) for  adults born from [birth year] through 1963.   Never done  . Pneumonia vaccines (1 of 2 - PCV13) Never done  . Hemoglobin A1C  08/17/2020  . Eye exam for diabetics  01/03/2021  . Complete foot exam   02/16/2021  . Tetanus Vaccine  06/15/2021  . Colon Cancer Screening  08/17/2022  . Flu Shot  Completed  . COVID-19 Vaccine  Completed

## 2020-03-22 NOTE — Progress Notes (Signed)
Subjective:  Patient ID: Lance Riddle, male    DOB: [birth year]  Age: 74 y.o. MRN: [id number]  Chief Complaint  Patient presents with  . Annual Exam    annual wellness visit    HPI Encounter for general adult medical examination without abnormal findings  Physical ("At Risk" items are starred): Patient's last physical exam was 1 year ago .   Growth percentile SmartLinks can only be used for patients less than 108 years old.   SDOH Screenings   Alcohol Screen: Low Risk   . Last Alcohol Screening Score (AUDIT): 0  Depression (PHQ2-9): Low Risk   . PHQ-2 Score: 0  Financial Resource Strain: Low Risk   . Difficulty of Paying Living Expenses: Not hard at all  Food Insecurity: No Food Insecurity  . Worried About Charity fundraiser in the Last Year: Never true  . Ran Out of Food in the Last Year: Never true  Housing: Low Risk   . Last Housing Risk Score: 0  Physical Activity: Sufficiently Active  . Days of Exercise per Week: 3 days  . Minutes of Exercise per Session: 60 min  Social Connections: Moderately Isolated  . Frequency of Communication with Friends and Family: Twice a week  . Frequency of Social Gatherings with Friends and Family: Twice a week  . Attends Religious Services: More than 4 times per year  . Active Member of Clubs or Organizations: No  . Attends Archivist Meetings: Never  . Marital Status: Widowed  Stress: No Stress Concern Present  . Feeling of Stress : Not at all  Tobacco Use: High Risk  . Smoking Tobacco Use: Former Smoker  . Smokeless Tobacco Use: Current User  Transportation Needs: No Transportation Needs  . Lack of Transportation (Medical): No  . Lack of Transportation (Non-Medical): No    Fall Risk  03/22/2020 10/14/2019  Falls in the past year? 0 0  Number falls in past yr: 0 0  Injury with Fall? 0 0  Follow up - Falls evaluation completed    Depression screen [facility] 2/9 03/22/2020 10/14/2019  Decreased Interest 0 0  Down, Depressed,  Hopeless 0 0  PHQ - 2 Score 0 0    Functional Status Survey: Is the patient deaf or have difficulty hearing?: No Does the patient have difficulty seeing, even when wearing glasses/contacts?: No Does the patient have difficulty concentrating, remembering, or making decisions?: No Does the patient have difficulty walking or climbing stairs?: No Does the patient have difficulty dressing or bathing?: No Does the patient have difficulty doing errands alone such as visiting a doctor's office or shopping?: No   Safety: reviewed ;  Patient wears a seat belt. Patient's home has smoke detectors and carbon monoxide detectors. Patient practices appropriate gun safety Patient wears sunscreen with extended sun exposure. Dental Care: biannual cleanings, brushes and flosses daily. Ophthalmology/Optometry: Annual visit.  Hearing loss: none Vision impairments: none Patient is not afflicted from Stress Incontinence and Urge Incontinence   Current Outpatient Medications on File Prior to Visit  Medication Sig Dispense Refill  . amLODipine (NORVASC) 10 MG tablet Take 1 tablet (10 mg total) by mouth daily. 90 tablet 2  . aspirin 81 MG tablet Take 81 mg by mouth daily.      . finasteride (PROSCAR) 5 MG tablet Take 1 tablet (5 mg total) by mouth daily. 90 tablet 2  . glimepiride (AMARYL) 4 MG tablet Take 1 tablet (4 mg total) by mouth daily. 90 tablet 2  .  lisinopril-hydrochlorothiazide (ZESTORETIC) 20-12.5 MG tablet Take 1 tablet by mouth daily. 90 tablet 2  . metFORMIN (GLUCOPHAGE) 500 MG tablet Take 1 tablet (500 mg total) by mouth 2 (two) times daily. 180 tablet 2  . nitroGLYCERIN (NITROSTAT) 0.4 MG SL tablet Place 1 tablet (0.4 mg total) under the tongue every 5 (five) minutes as needed for chest pain. 25 tablet 3  . omeprazole (PRILOSEC) 20 MG capsule Take 1 capsule (20 mg total) by mouth daily. 90 capsule 2  . simvastatin (ZOCOR) 40 MG tablet Take 1 tablet (40 mg total) by mouth daily. 90 tablet 2  .  tamsulosin (FLOMAX) 0.4 MG CAPS capsule Take 1 capsule (0.4 mg total) by mouth daily. 90 capsule 2   No current facility-administered medications on file prior to visit.    Social Hx   Social History   Socioeconomic History  . Marital status: Married    Spouse name: Not on file  . Number of children: 0  . Years of education: Not on file  . Highest education level: Not on file  Occupational History  . Occupation: farmer  Tobacco Use  . Smoking status: Former Smoker    Types: Cigarettes    Quit date: 1994    Years since quitting: 27.8  . Smokeless tobacco: Current User    Types: Chew  . Tobacco comment: 1 can a week  Substance and Sexual Activity  . Alcohol use: Not Currently  . Drug use: Not Currently  . Sexual activity: Not Currently  Other Topics Concern  . Not on file  Social History Narrative  . Not on file   Social Determinants of Health   Financial Resource Strain: Low Risk   . Difficulty of Paying Living Expenses: Not hard at all  Food Insecurity: No Food Insecurity  . Worried About Charity fundraiser in the Last Year: Never true  . Ran Out of Food in the Last Year: Never true  Transportation Needs: No Transportation Needs  . Lack of Transportation (Medical): No  . Lack of Transportation (Non-Medical): No  Physical Activity: Sufficiently Active  . Days of Exercise per Week: 3 days  . Minutes of Exercise per Session: 60 min  Stress: No Stress Concern Present  . Feeling of Stress : Not at all  Social Connections: Moderately Isolated  . Frequency of Communication with Friends and Family: Twice a week  . Frequency of Social Gatherings with Friends and Family: Twice a week  . Attends Religious Services: More than 4 times per year  . Active Member of Clubs or Organizations: No  . Attends Archivist Meetings: Never  . Marital Status: Widowed   Past Medical History:  Diagnosis Date  . Acute myocardial infarction, unspecified site, episode of care  unspecified   . Coronary atherosclerosis of native coronary artery    2007 catheterization D1 70%, ramus intermediate 40%, large RV branch off the right coronary artery 60-70%.; Lexiscan Myoview (03/2013):  No ischemia, EF 58% (normal study).  . Other and unspecified hyperlipidemia   . Unspecified essential hypertension    Family History  Problem Relation Age of Onset  . Heart attack Mother   . Coronary artery disease Mother   . Lung cancer Father   . Diabetes Father   . Hypertension Father   . Cancer Brother   . Coronary artery disease Other        paternal side of family  . Diabetes Brother   . Drug abuse Brother  Review of Systems  Constitutional: Negative for activity change and appetite change.  HENT: Positive for congestion. Negative for dental problem.   Eyes: Negative.  Negative for discharge and itching.  Respiratory: Negative.  Negative for cough and shortness of breath.   Cardiovascular: Negative for chest pain, palpitations and leg swelling.  Gastrointestinal: Positive for constipation.  Endocrine: Positive for polydipsia.  Genitourinary: Negative for difficulty urinating and dysuria.  Musculoskeletal: Negative.   Skin: Negative.   Neurological: Negative.   Psychiatric/Behavioral: Negative.      Objective:  BP 128/64 (BP Location: Right Arm, Patient Position: Sitting)   Pulse 70   Temp 97.6 F (36.4 C)   Resp 16   Ht 5\' 4"  (1.626 m)   Wt 180 lb (81.6 kg)   SpO2 98%   BMI 30.90 kg/m   BP/Weight 03/22/2020 02/17/2020 5/36/6440  Systolic BP 347 425 956  Diastolic BP 64 60 68  Wt. (Lbs) 180 177 176.6  BMI 30.9 30.38 30.31    Physical Exam NA  Lab Results  Component Value Date   WBC 8.7 02/17/2020   HGB 14.5 02/17/2020   HCT 42.7 02/17/2020   PLT 232 02/17/2020   GLUCOSE 132 (H) 02/17/2020   CHOL 140 02/17/2020   TRIG 81 02/17/2020   HDL 61 02/17/2020   LDLCALC 63 02/17/2020   ALT 16 02/17/2020   AST 10 02/17/2020   NA 130 (L) 02/17/2020     K 5.1 02/17/2020   CL 93 (L) 02/17/2020   CREATININE 0.99 02/17/2020   BUN 11 02/17/2020   CO2 22 02/17/2020   TSH 1.030 02/17/2020   HGBA1C 6.6 (H) 02/17/2020   MICROALBUR 30 06/17/2019      Assessment & Plan:  1. Routine general medical examination at a health care facility Patient had an annual wellness visit all items have been reviewed.  He does have a living well and will bring in a copy.  All his immunizations are up-to-date and routine instructions were given to him at checkout.    These are the goals we discussed: Goals    . Activity and Exercise Increased     Evidence-based guidance:  Review current exercise levels.  Assess patient perspective on exercise or activity level, barriers to increasing activity, motivation and readiness for change.  Recommend or set healthy exercise goal based on individual tolerance.  Encourage small steps toward making change in amount of exercise or activity.  Urge reduction of sedentary activities or screen time.  Promote group activities within the community or with family or support person.  Consider referral to rehabiliation therapist for assessment and exercise/activity plan.   Notes:         This is a list of the screening recommended for you and due dates:  Health Maintenance  Topic Date Due  .  Hepatitis C: One time screening is recommended by Center for Disease Control  (CDC) for  adults born from [birth year] through 1963.   Never done  . Pneumonia vaccines (1 of 2 - PCV13) Never done  . Hemoglobin A1C  08/17/2020  . Eye exam for diabetics  01/03/2021  . Complete foot exam   02/16/2021  . Tetanus Vaccine  06/15/2021  . Colon Cancer Screening  08/17/2022  . Flu Shot  Completed  . COVID-19 Vaccine  Completed      AN INDIVIDUALIZED CARE PLAN: was established or reinforced today.   SELF MANAGEMENT: The patient and I together assessed ways to personally work towards obtaining the recommended  goals  Support needs The patient  and/or family needs were assessed and services were offered and not necessary at this time.    Follow-up: Return in about 6 months (around 09/19/2020). [place] [phone]

## 2020-04-26 ENCOUNTER — Encounter: Payer: Self-pay | Admitting: Legal Medicine

## 2020-06-19 ENCOUNTER — Other Ambulatory Visit: Payer: Self-pay

## 2020-06-19 ENCOUNTER — Encounter: Payer: Self-pay | Admitting: Legal Medicine

## 2020-06-19 ENCOUNTER — Ambulatory Visit (INDEPENDENT_AMBULATORY_CARE_PROVIDER_SITE_OTHER): Payer: Medicare Other | Admitting: Legal Medicine

## 2020-06-19 VITALS — BP 122/68 | HR 75 | Temp 97.5°F | Resp 16 | Ht 64.0 in | Wt 183.0 lb

## 2020-06-19 DIAGNOSIS — E1159 Type 2 diabetes mellitus with other circulatory complications: Secondary | ICD-10-CM

## 2020-06-19 DIAGNOSIS — N401 Enlarged prostate with lower urinary tract symptoms: Secondary | ICD-10-CM | POA: Diagnosis not present

## 2020-06-19 DIAGNOSIS — E1169 Type 2 diabetes mellitus with other specified complication: Secondary | ICD-10-CM | POA: Diagnosis not present

## 2020-06-19 DIAGNOSIS — E782 Mixed hyperlipidemia: Secondary | ICD-10-CM | POA: Diagnosis not present

## 2020-06-19 DIAGNOSIS — E669 Obesity, unspecified: Secondary | ICD-10-CM

## 2020-06-19 DIAGNOSIS — N138 Other obstructive and reflux uropathy: Secondary | ICD-10-CM

## 2020-06-19 DIAGNOSIS — I2510 Atherosclerotic heart disease of native coronary artery without angina pectoris: Secondary | ICD-10-CM

## 2020-06-19 DIAGNOSIS — I10 Essential (primary) hypertension: Secondary | ICD-10-CM

## 2020-06-19 DIAGNOSIS — K2100 Gastro-esophageal reflux disease with esophagitis, without bleeding: Secondary | ICD-10-CM

## 2020-06-19 DIAGNOSIS — I152 Hypertension secondary to endocrine disorders: Secondary | ICD-10-CM

## 2020-06-19 NOTE — Progress Notes (Signed)
Subjective:  Patient ID: Lance Riddle, male    DOB: 09-18-1945  Age: 75 y.o. MRN: [id number]  Chief Complaint  Patient presents with  . Hypertension  . Hyperlipidemia    HPI: chronic visit  Patient presents for follow up of hypertension.  Patient tolerating lisinopril well with side effects.  Patient was diagnosed with hypertension 2010 so has been treated for hypertension for 10 years.Patient is working on maintaining diet and exercise regimen and follows up as directed. Complication include cad.  Patient presents with hyperlipidemia.  Compliance with treatment has been good; patient takes medicines as directed, maintains low cholesterol diet, follows up as directed, and maintains exercise regimen.  Patient is using simvastatin without problems.   Patient present with type 2 diabetes.  Specifically, this is type 2, noninsulin requiring diabetes, complicated by hyperlipidemia dn hypertension.  Compliance with treatment has been good; patient take medicines as directed, maintains diet and exercise regimen, follows up as directed, and is keeping glucose diary.  Date of  diagnosis 2010.  Depression screen has been performed.Tobacco screen nonsmoke. Current medicines for diabetes glimipride, metformin.  Patient is on lisinopril for renal protection and simvastatin for cholesterol control.  Patient performs foot exams daily and last ophthalmologic exam was yes. Current Outpatient Medications on File Prior to Visit  Medication Sig Dispense Refill  . amLODipine (NORVASC) 10 MG tablet Take 1 tablet (10 mg total) by mouth daily. 90 tablet 2  . aspirin 81 MG tablet Take 81 mg by mouth daily.    . finasteride (PROSCAR) 5 MG tablet Take 1 tablet (5 mg total) by mouth daily. 90 tablet 2  . glimepiride (AMARYL) 4 MG tablet Take 1 tablet (4 mg total) by mouth daily. 90 tablet 2  . lisinopril-hydrochlorothiazide (ZESTORETIC) 20-12.5 MG tablet Take 1 tablet by mouth daily. 90 tablet 2  . metFORMIN  (GLUCOPHAGE) 500 MG tablet Take 1 tablet (500 mg total) by mouth 2 (two) times daily. 180 tablet 2  . nitroGLYCERIN (NITROSTAT) 0.4 MG SL tablet Place 1 tablet (0.4 mg total) under the tongue every 5 (five) minutes as needed for chest pain. 25 tablet 3  . omeprazole (PRILOSEC) 20 MG capsule Take 1 capsule (20 mg total) by mouth daily. 90 capsule 2  . simvastatin (ZOCOR) 40 MG tablet Take 1 tablet (40 mg total) by mouth daily. 90 tablet 2  . tamsulosin (FLOMAX) 0.4 MG CAPS capsule Take 1 capsule (0.4 mg total) by mouth daily. 90 capsule 2   No current facility-administered medications on file prior to visit.   Past Medical History:  Diagnosis Date  . Acute myocardial infarction, unspecified site, episode of care unspecified   . Coronary atherosclerosis of native coronary artery    2007 catheterization D1 70%, ramus intermediate 40%, large RV branch off the right coronary artery 60-70%.; Lexiscan Myoview (03/2013):  No ischemia, EF 58% (normal study).  . Other and unspecified hyperlipidemia   . Unspecified essential hypertension    Past Surgical History:  Procedure Laterality Date  . ANGIOPLASTY  1994  . CARDIAC SURGERY    . TONSILLECTOMY AND ADENOIDECTOMY      Family History  Problem Relation Age of Onset  . Heart attack Mother   . Coronary artery disease Mother   . Lung cancer Father   . Diabetes Father   . Hypertension Father   . Cancer Brother   . Coronary artery disease Other        paternal side of family  . Diabetes Brother   .  Drug abuse Brother    Social History   Socioeconomic History  . Marital status: Married    Spouse name: Not on file  . Number of children: 0  . Years of education: Not on file  . Highest education level: Not on file  Occupational History  . Occupation: farmer  Tobacco Use  . Smoking status: Former Smoker    Types: Cigarettes    Quit date: 1994    Years since quitting: 28.1  . Smokeless tobacco: Current User    Types: Chew  . Tobacco  comment: 1 can a week  Substance and Sexual Activity  . Alcohol use: Not Currently  . Drug use: Not Currently  . Sexual activity: Not Currently  Other Topics Concern  . Not on file  Social History Narrative  . Not on file   Social Determinants of Health   Financial Resource Strain: Low Risk   . Difficulty of Paying Living Expenses: Not hard at all  Food Insecurity: No Food Insecurity  . Worried About Charity fundraiser in the Last Year: Never true  . Ran Out of Food in the Last Year: Never true  Transportation Needs: No Transportation Needs  . Lack of Transportation (Medical): No  . Lack of Transportation (Non-Medical): No  Physical Activity: Sufficiently Active  . Days of Exercise per Week: 3 days  . Minutes of Exercise per Session: 60 min  Stress: No Stress Concern Present  . Feeling of Stress : Not at all  Social Connections: Moderately Isolated  . Frequency of Communication with Friends and Family: Twice a week  . Frequency of Social Gatherings with Friends and Family: Twice a week  . Attends Religious Services: More than 4 times per year  . Active Member of Clubs or Organizations: No  . Attends Archivist Meetings: Never  . Marital Status: Widowed    Review of Systems  Constitutional: Negative for activity change and diaphoresis.  HENT: Negative for congestion, rhinorrhea and sinus pain.   Eyes: Negative for visual disturbance.  Respiratory: Negative for apnea, chest tightness and wheezing.   Cardiovascular: Negative for chest pain, palpitations and leg swelling.  Gastrointestinal: Negative for abdominal distention and abdominal pain.  Endocrine: Negative for polyuria.  Genitourinary: Negative for difficulty urinating.  Musculoskeletal: Negative for arthralgias and back pain.  Skin: Negative.   Neurological: Negative.   Psychiatric/Behavioral: Negative.      Objective:  BP 122/68 (BP Location: Left Arm, Patient Position: Sitting, Cuff Size: Normal)    Pulse 75   Temp (!) 97.5 F (36.4 C) (Temporal)   Resp 16   Ht 5\' 4"  (1.626 m)   Wt 183 lb (83 kg)   SpO2 97%   BMI 31.41 kg/m   BP/Weight 06/19/2020 03/22/2020 73/06/2023  Systolic BP 427 062 376  Diastolic BP 68 64 60  Wt. (Lbs) 183 180 177  BMI 31.41 30.9 30.38    Physical Exam Vitals reviewed.  Constitutional:      General: He is not in acute distress.    Appearance: Normal appearance.  HENT:     Head: Normocephalic.     Right Ear: Tympanic membrane, ear canal and external ear normal.     Left Ear: Tympanic membrane, ear canal and external ear normal.     Mouth/Throat:     Mouth: Mucous membranes are moist.     Pharynx: Oropharynx is clear.  Eyes:     Extraocular Movements: Extraocular movements intact.     Conjunctiva/sclera: Conjunctivae  normal.     Pupils: Pupils are equal, round, and reactive to light.  Cardiovascular:     Rate and Rhythm: Normal rate and regular rhythm.     Pulses: Normal pulses.     Heart sounds: Normal heart sounds. No murmur heard. No gallop.   Pulmonary:     Effort: Pulmonary effort is normal. No respiratory distress.     Breath sounds: No rales.  Abdominal:     General: Abdomen is flat. Bowel sounds are normal. There is no distension.     Palpations: Abdomen is soft.     Tenderness: There is no abdominal tenderness.  Musculoskeletal:        General: Normal range of motion.     Cervical back: Normal range of motion and neck supple.  Skin:    General: Skin is warm.     Capillary Refill: Capillary refill takes less than 2 seconds.  Neurological:     General: No focal deficit present.     Mental Status: He is alert. Mental status is at baseline. He is disoriented.  Psychiatric:        Mood and Affect: Mood normal.        Thought Content: Thought content normal.        Judgment: Judgment normal.     Diabetic Foot Exam - Simple   Simple Foot Form Diabetic Foot exam was performed with the following findings: Yes 06/19/2020  9:01 AM   Visual Inspection No deformities, no ulcerations, no other skin breakdown bilaterally: Yes Sensation Testing Intact to touch and monofilament testing bilaterally: Yes Pulse Check Posterior Tibialis and Dorsalis pulse intact bilaterally: Yes Comments      Lab Results  Component Value Date   WBC 8.7 02/17/2020   HGB 14.5 02/17/2020   HCT 42.7 02/17/2020   PLT 232 02/17/2020   GLUCOSE 132 (H) 02/17/2020   CHOL 140 02/17/2020   TRIG 81 02/17/2020   HDL 61 02/17/2020   LDLCALC 63 02/17/2020   ALT 16 02/17/2020   AST 10 02/17/2020   NA 130 (L) 02/17/2020   K 5.1 02/17/2020   CL 93 (L) 02/17/2020   CREATININE 0.99 02/17/2020   BUN 11 02/17/2020   CO2 22 02/17/2020   TSH 1.030 02/17/2020   HGBA1C 6.6 (H) 02/17/2020   MICROALBUR 30 06/17/2019      Assessment & Plan:    Diagnoses and all orders for this visit: Combined hyperlipidemia associated with type 2 diabetes mellitus ([place]) -     Lipid panel AN INDIVIDUAL CARE PLAN for hyperlipidemia/ cholesterol was established and reinforced today.  The patient's status was assessed using clinical findings on exam, lab and other diagnostic tests. The patient's disease status was assessed based on evidence-based guidelines and found to be well controlled. MEDICATIONS were reviewed. SELF MANAGEMENT GOALS have been discussed and patient's success at attaining the goal of low cholesterol was assessed. RECOMMENDATION given include regular exercise 3 days a week and low cholesterol/low fat diet. CLINICAL SUMMARY including written plan to identify barriers unique to the patient due to social or economic  reasons was discussed.  Essential hypertension -     CBC with Differential/Platelet -     Comprehensive metabolic panel An individual hypertension care plan was established and reinforced today.  The patient's status was assessed using clinical findings on exam and labs or diagnostic tests. The patient's success at meeting treatment goals  on disease specific evidence-based guidelines and found to be well controlled. SELF MANAGEMENT: The  patient and I together assessed ways to personally work towards obtaining the recommended goals. RECOMMENDATIONS: avoid decongestants found in common cold remedies, decrease consumption of alcohol, perform routine monitoring of BP with home BP cuff, exercise, reduction of dietary salt, take medicines as prescribed, try not to miss doses and quit smoking.  Regular exercise and maintaining a healthy weight is needed.  Stress reduction may help. A CLINICAL SUMMARY including written plan identify barriers to care unique to individual due to social or financial issues.  We attempt to mutually creat solutions for individual and family understanding.  Enlarged prostate with urinary obstruction -     PSA AN INDIVIDUAL CARE PLAN for BPH was established and reinforced today.  The patient's status was assessed using clinical findings on exam, labs, and other diagnostic testing. Patient's success at meeting treatment goals based on disease specific evidence-bassed guidelines and found to be in fair control. RECOMMENDATIONS include maintaining present medicines and treatment.  Obesity, diabetes, and hypertension syndrome ([place]) -     Hemoglobin A1c An individual care plan for diabetes was established and reinforced today.  The patient's status was assessed using clinical findings on exam, labs and diagnostic testing. Patient success at meeting goals based on disease specific evidence-based guidelines and found to be good controlled. Medications were assessed and patient's understanding of the medical issues , including barriers were assessed. Recommend adherence to a diabetic diet, a graduated exercise program, HgbA1c level is checked quarterly, and urine microalbumin performed yearly .  Annual mono-filament sensation testing performed. Lower blood pressure and control hyperlipidemia is important. Get annual eye exams  and annual flu shots and smoking cessation discussed.  Self management goals were discussed.  Gastroesophageal reflux disease with esophagitis without hemorrhage Plan of care was formulated today.  She is doing fair.  A plan of care was formulated using patient exam, tests and other sources to optimize care using evidence based information.  Recommend no smoking, no eating after supper, avoid fatty foods, elevate Head of bed, avoid tight fitting clothing.  Continue on omeprazole . Atherosclerosis of native coronary artery of native heart without angina pectoris Patient's CAD was assessed using history and physical along with other information to maximize treatment.  Evidence based criteria was use in deciding proper management for this disease process.  Patient's CAD is under good control.therapy continue present medicines.        I spent 35 minutes dedicated to the care of this patient on the date of this encounter to include face-to-face time with the patient, as well as: reviewed records  Follow-up: Return in about 4 months (around 10/17/2020) for fasting.  An After Visit Summary was printed and given to the patient.  Reinaldo Meeker, MD [facility] [phone]

## 2020-06-20 LAB — CBC WITH DIFFERENTIAL/PLATELET
Basophils Absolute: 0 10*3/uL (ref 0.0–0.2)
Basos: 1 %
EOS (ABSOLUTE): 0.3 10*3/uL (ref 0.0–0.4)
Eos: 4 %
Hematocrit: 43.4 % (ref 37.5–51.0)
Hemoglobin: 14.4 g/dL (ref 13.0–17.7)
Immature Grans (Abs): 0 10*3/uL (ref 0.0–0.1)
Immature Granulocytes: 0 %
Lymphocytes Absolute: 1.9 10*3/uL (ref 0.7–3.1)
Lymphs: 26 %
MCH: 29.9 pg (ref 26.6–33.0)
MCHC: 33.2 g/dL (ref 31.5–35.7)
MCV: 90 fL (ref 79–97)
Monocytes Absolute: 0.8 10*3/uL (ref 0.1–0.9)
Monocytes: 11 %
Neutrophils Absolute: 4.3 10*3/uL (ref 1.4–7.0)
Neutrophils: 58 %
Platelets: 252 10*3/uL (ref 150–450)
RBC: 4.82 x10E6/uL (ref 4.14–5.80)
RDW: 12.7 % (ref 11.6–15.4)
WBC: 7.3 10*3/uL (ref 3.4–10.8)

## 2020-06-20 LAB — COMPREHENSIVE METABOLIC PANEL
ALT: 15 IU/L (ref 0–44)
AST: 12 IU/L (ref 0–40)
Albumin/Globulin Ratio: 1.7 (ref 1.2–2.2)
Albumin: 4.6 g/dL (ref 3.7–4.7)
Alkaline Phosphatase: 48 IU/L (ref 44–121)
BUN/Creatinine Ratio: 11 (ref 10–24)
BUN: 13 mg/dL (ref 8–27)
Bilirubin Total: 0.3 mg/dL (ref 0.0–1.2)
CO2: 22 mmol/L (ref 20–29)
Calcium: 9.7 mg/dL (ref 8.6–10.2)
Chloride: 97 mmol/L (ref 96–106)
Creatinine, Ser: 1.15 mg/dL (ref 0.76–1.27)
GFR calc Af Amer: 73 mL/min/{1.73_m2} (ref >59)
GFR calc non Af Amer: 63 mL/min/{1.73_m2} (ref >59)
Globulin, Total: 2.7 g/dL (ref 1.5–4.5)
Glucose: 143 mg/dL — ABNORMAL HIGH (ref 65–99)
Potassium: 5.4 mmol/L — ABNORMAL HIGH (ref 3.5–5.2)
Sodium: 134 mmol/L (ref 134–144)
Total Protein: 7.3 g/dL (ref 6.0–8.5)

## 2020-06-20 LAB — HEMOGLOBIN A1C
Est. average glucose Bld gHb Est-mCnc: 157 mg/dL
Hgb A1c MFr Bld: 7.1 % — ABNORMAL HIGH (ref 4.8–5.6)

## 2020-06-20 LAB — LIPID PANEL
Chol/HDL Ratio: 2.7 ratio (ref 0.0–5.0)
Cholesterol, Total: 150 mg/dL (ref 100–199)
HDL: 56 mg/dL (ref >39)
LDL Chol Calc (NIH): 70 mg/dL (ref 0–99)
Triglycerides: 137 mg/dL (ref 0–149)
VLDL Cholesterol Cal: 24 mg/dL (ref 5–40)

## 2020-06-20 LAB — PSA: Prostate Specific Ag, Serum: 0.4 ng/mL (ref 0.0–4.0)

## 2020-06-20 LAB — CARDIOVASCULAR RISK ASSESSMENT

## 2020-06-20 NOTE — Progress Notes (Signed)
PSA 0.4 normal, CBC normal, glucose 143, kidney tests normal, potassium high 5.4 recheck in one week, liver tests normal, A1c 7.1, cholesterol OK lp

## 2020-06-26 ENCOUNTER — Other Ambulatory Visit: Payer: Medicare Other

## 2020-06-26 ENCOUNTER — Other Ambulatory Visit: Payer: Self-pay

## 2020-06-26 DIAGNOSIS — E875 Hyperkalemia: Secondary | ICD-10-CM | POA: Diagnosis not present

## 2020-06-26 LAB — COMPREHENSIVE METABOLIC PANEL
ALT: 18 IU/L (ref 0–44)
AST: 15 IU/L (ref 0–40)
Albumin/Globulin Ratio: 1.8 (ref 1.2–2.2)
Albumin: 4.5 g/dL (ref 3.7–4.7)
Alkaline Phosphatase: 46 IU/L (ref 44–121)
BUN/Creatinine Ratio: 13 (ref 10–24)
BUN: 14 mg/dL (ref 8–27)
Bilirubin Total: <0.2 mg/dL (ref 0.0–1.2)
CO2: 21 mmol/L (ref 20–29)
Calcium: 9.8 mg/dL (ref 8.6–10.2)
Chloride: 95 mmol/L — ABNORMAL LOW (ref 96–106)
Creatinine, Ser: 1.12 mg/dL (ref 0.76–1.27)
GFR calc Af Amer: 75 mL/min/{1.73_m2} (ref >59)
GFR calc non Af Amer: 65 mL/min/{1.73_m2} (ref >59)
Globulin, Total: 2.5 g/dL (ref 1.5–4.5)
Glucose: 157 mg/dL — ABNORMAL HIGH (ref 65–99)
Potassium: 5.3 mmol/L — ABNORMAL HIGH (ref 3.5–5.2)
Sodium: 132 mmol/L — ABNORMAL LOW (ref 134–144)
Total Protein: 7 g/dL (ref 6.0–8.5)

## 2020-06-27 NOTE — Progress Notes (Signed)
Glucose 157 high, kidney tests normal, potassium 5.3 still 5.3 stop any potassium supplement, including "Lite Salt",  liver tests normal,  lp

## 2020-07-01 ENCOUNTER — Other Ambulatory Visit: Payer: Self-pay | Admitting: Legal Medicine

## 2020-07-01 DIAGNOSIS — K2100 Gastro-esophageal reflux disease with esophagitis, without bleeding: Secondary | ICD-10-CM

## 2020-07-04 DIAGNOSIS — E119 Type 2 diabetes mellitus without complications: Secondary | ICD-10-CM | POA: Diagnosis not present

## 2020-07-11 ENCOUNTER — Other Ambulatory Visit: Payer: Self-pay | Admitting: Legal Medicine

## 2020-07-11 DIAGNOSIS — E1169 Type 2 diabetes mellitus with other specified complication: Secondary | ICD-10-CM

## 2020-07-11 DIAGNOSIS — N138 Other obstructive and reflux uropathy: Secondary | ICD-10-CM

## 2020-07-11 DIAGNOSIS — I2510 Atherosclerotic heart disease of native coronary artery without angina pectoris: Secondary | ICD-10-CM

## 2020-07-11 DIAGNOSIS — I10 Essential (primary) hypertension: Secondary | ICD-10-CM

## 2020-07-11 DIAGNOSIS — N401 Enlarged prostate with lower urinary tract symptoms: Secondary | ICD-10-CM

## 2020-10-02 DIAGNOSIS — E119 Type 2 diabetes mellitus without complications: Secondary | ICD-10-CM | POA: Diagnosis not present

## 2020-10-13 ENCOUNTER — Other Ambulatory Visit: Payer: Self-pay | Admitting: Legal Medicine

## 2020-10-13 DIAGNOSIS — N401 Enlarged prostate with lower urinary tract symptoms: Secondary | ICD-10-CM

## 2020-10-13 DIAGNOSIS — I152 Hypertension secondary to endocrine disorders: Secondary | ICD-10-CM

## 2020-10-13 DIAGNOSIS — N138 Other obstructive and reflux uropathy: Secondary | ICD-10-CM

## 2020-10-13 DIAGNOSIS — E1159 Type 2 diabetes mellitus with other circulatory complications: Secondary | ICD-10-CM

## 2020-10-17 ENCOUNTER — Ambulatory Visit: Payer: Medicare Other | Admitting: Legal Medicine

## 2020-10-23 ENCOUNTER — Encounter: Payer: Self-pay | Admitting: Legal Medicine

## 2020-10-23 ENCOUNTER — Ambulatory Visit (INDEPENDENT_AMBULATORY_CARE_PROVIDER_SITE_OTHER): Payer: Medicare Other | Admitting: Legal Medicine

## 2020-10-23 ENCOUNTER — Other Ambulatory Visit: Payer: Self-pay

## 2020-10-23 VITALS — BP 100/50 | HR 66 | Temp 97.4°F | Resp 16 | Ht 64.0 in | Wt 182.0 lb

## 2020-10-23 DIAGNOSIS — I10 Essential (primary) hypertension: Secondary | ICD-10-CM

## 2020-10-23 DIAGNOSIS — E1169 Type 2 diabetes mellitus with other specified complication: Secondary | ICD-10-CM

## 2020-10-23 DIAGNOSIS — E782 Mixed hyperlipidemia: Secondary | ICD-10-CM

## 2020-10-23 DIAGNOSIS — N401 Enlarged prostate with lower urinary tract symptoms: Secondary | ICD-10-CM

## 2020-10-23 DIAGNOSIS — E1159 Type 2 diabetes mellitus with other circulatory complications: Secondary | ICD-10-CM

## 2020-10-23 DIAGNOSIS — K2100 Gastro-esophageal reflux disease with esophagitis, without bleeding: Secondary | ICD-10-CM

## 2020-10-23 DIAGNOSIS — I2510 Atherosclerotic heart disease of native coronary artery without angina pectoris: Secondary | ICD-10-CM | POA: Diagnosis not present

## 2020-10-23 DIAGNOSIS — N138 Other obstructive and reflux uropathy: Secondary | ICD-10-CM | POA: Diagnosis not present

## 2020-10-23 DIAGNOSIS — I152 Hypertension secondary to endocrine disorders: Secondary | ICD-10-CM | POA: Diagnosis not present

## 2020-10-23 DIAGNOSIS — E669 Obesity, unspecified: Secondary | ICD-10-CM | POA: Diagnosis not present

## 2020-10-23 LAB — POCT UA - MICROALBUMIN: Microalbumin Ur, POC: 10 mg/L

## 2020-10-23 NOTE — Progress Notes (Signed)
Established Patient Office Visit  Subjective:  Patient ID: Lance Riddle, male    DOB: 14-Aug-1945  Age: 75 y.o. MRN: [id number]  CC:  Chief Complaint  Patient presents with   Diabetes   Hypertension   Hyperlipidemia    HPI Lance Riddle presents for chronic visit  Patient present with type 2 diabetes.  Specifically, this is type 2, noninsulin requiring diabetes, complicated by hypertneion and hyperlipidemia.  Compliance with treatment has been good; patient take medicines as directed, maintains diet and exercise regimen, follows up as directed, and is keeping glucose diary.  Date of  diagnosis 2010.  Depression screen has been performed.Tobacco screen smoker. Current medicines for diabetes amaryl, metformin.  Patient is on lisinopril for renal protection and simvasttin for cholesterol control.  Patient performs foot exams daily and last ophthalmologic exam was yes.   Patient presents with hyperlipidemia.  Compliance with treatment has been good; patient takes medicines as directed, maintains low cholesterol diet, follows up as directed, and maintains exercise regimen.  Patient is using simvastatin without problems.   Patient presents for follow up of hypertension.  Patient tolerating amlodipine well with side effects.  Patient was diagnosed with hypertension 2010 so has been treated for hypertension for 10 years.Patient is working on maintaining diet and exercise regimen and follows up as directed. Complication include none.   Past Medical History:  Diagnosis Date   Acute myocardial infarction, unspecified site, episode of care unspecified    Coronary atherosclerosis of native coronary artery    2007 catheterization D1 70%, ramus intermediate 40%, large RV branch off the right coronary artery 60-70%.; Lexiscan Myoview (03/2013):  No ischemia, EF 58% (normal study).   Other and unspecified hyperlipidemia    Unspecified essential hypertension     Past Surgical History:  Procedure  Laterality Date   ANGIOPLASTY  1994   CARDIAC SURGERY     TONSILLECTOMY AND ADENOIDECTOMY      Family History  Problem Relation Age of Onset   Heart attack Mother    Coronary artery disease Mother    Lung cancer Father    Diabetes Father    Hypertension Father    Cancer Brother    Coronary artery disease Other        paternal side of family   Diabetes Brother    Drug abuse Brother     Social History   Socioeconomic History   Marital status: Married    Spouse name: Not on file   Number of children: 0   Years of education: Not on file   Highest education level: Not on file  Occupational History   Occupation: farmer  Tobacco Use   Smoking status: Former    Pack years: 0.00    Types: Cigarettes    Quit date: 1994    Years since quitting: 28.4   Smokeless tobacco: Current    Types: Chew   Tobacco comments:    1 can a week  Substance and Sexual Activity   Alcohol use: Not Currently   Drug use: Not Currently   Sexual activity: Not Currently  Other Topics Concern   Not on file  Social History Narrative   Not on file   Social Determinants of Health   Financial Resource Strain: Low Risk    Difficulty of Paying Living Expenses: Not hard at all  Food Insecurity: No Food Insecurity   Worried About Charity fundraiser in the Last Year: Never true   [employer]  in the Last Year: Never true  Transportation Needs: No Transportation Needs   Lack of Transportation (Medical): No   Lack of Transportation (Non-Medical): No  Physical Activity: Sufficiently Active   Days of Exercise per Week: 3 days   Minutes of Exercise per Session: 60 min  Stress: No Stress Concern Present   Feeling of Stress : Not at all  Social Connections: Moderately Isolated   Frequency of Communication with Friends and Family: Twice a week   Frequency of Social Gatherings with Friends and Family: Twice a week   Attends Religious Services: More than 4 times per year   Active Member of [employer] or  Organizations: No   Attends Archivist Meetings: Never   Marital Status: Widowed  Human resources officer Violence: Not At Risk   Fear of Current or Ex-Partner: No   Emotionally Abused: No   Physically Abused: No   Sexually Abused: No    Outpatient Medications Prior to Visit  Medication Sig Dispense Refill   amLODipine (NORVASC) 10 MG tablet TAKE 1 TABLET BY MOUTH ONCE DAILY 90 tablet 2   aspirin 81 MG tablet Take 81 mg by mouth daily.     finasteride (PROSCAR) 5 MG tablet TAKE 1 TABLET BY MOUTH  DAILY 90 tablet 2   glimepiride (AMARYL) 4 MG tablet TAKE 1 TABLET BY MOUTH  DAILY 90 tablet 2   lisinopril-hydrochlorothiazide (ZESTORETIC) 20-12.5 MG tablet TAKE 1 TABLET BY MOUTH  DAILY 90 tablet 2   metFORMIN (GLUCOPHAGE) 500 MG tablet TAKE 1 TABLET BY MOUTH  TWICE DAILY 180 tablet 2   nitroGLYCERIN (NITROSTAT) 0.4 MG SL tablet Place 1 tablet (0.4 mg total) under the tongue every 5 (five) minutes as needed for chest pain. 25 tablet 3   omeprazole (PRILOSEC) 20 MG capsule TAKE 1 CAPSULE BY MOUTH  DAILY 90 capsule 2   simvastatin (ZOCOR) 40 MG tablet TAKE 1 TABLET BY MOUTH ONCE DAILY 90 tablet 2   tamsulosin (FLOMAX) 0.4 MG CAPS capsule TAKE 1 CAPSULE BY MOUTH  ONCE DAILY 90 capsule 2   MODERNA COVID-19 VACCINE 100 MCG/0.5ML injection      No facility-administered medications prior to visit.    No Known Allergies  ROS Review of Systems  Constitutional:  Negative for chills, fatigue and fever.  HENT:  Negative for congestion, ear pain and sore throat.   Respiratory:  Negative for cough and shortness of breath.   Cardiovascular:  Negative for chest pain.  Gastrointestinal:  Negative for abdominal pain, constipation, diarrhea, nausea and vomiting.  Endocrine: Negative for polydipsia, polyphagia and polyuria.  Genitourinary:  Negative for dysuria and frequency.  Musculoskeletal:  Negative for arthralgias and myalgias.  Neurological:  Negative for dizziness and headaches.   Psychiatric/Behavioral:  Negative for dysphoric mood.        No dysphoria     Objective:    Physical Exam Vitals reviewed.  Constitutional:      Appearance: Normal appearance. He is normal weight.  HENT:     Head: Normocephalic.     Right Ear: Tympanic membrane, ear canal and external ear normal.     Left Ear: Tympanic membrane, ear canal and external ear normal.     Nose: Nose normal.     Mouth/Throat:     Mouth: Mucous membranes are moist.     Pharynx: Oropharynx is clear.  Eyes:     Extraocular Movements: Extraocular movements intact.     Conjunctiva/sclera: Conjunctivae normal.     Pupils: Pupils are equal,  round, and reactive to light.  Cardiovascular:     Rate and Rhythm: Normal rate and regular rhythm.     Pulses: Normal pulses.     Heart sounds: Normal heart sounds. No murmur heard.   No gallop.  Pulmonary:     Effort: Pulmonary effort is normal. No respiratory distress.     Breath sounds: Normal breath sounds. No wheezing.  Abdominal:     General: Abdomen is flat. Bowel sounds are normal. There is no distension.     Palpations: Abdomen is soft.     Tenderness: There is no abdominal tenderness.  Musculoskeletal:        General: Normal range of motion.     Cervical back: Normal range of motion.  Skin:    General: Skin is warm.     Capillary Refill: Capillary refill takes less than 2 seconds.  Neurological:     General: No focal deficit present.     Mental Status: He is alert and oriented to person, place, and time. Mental status is at baseline.  Psychiatric:        Mood and Affect: Mood normal.        Behavior: Behavior normal.    BP (!) 100/50   Pulse 66   Temp (!) 97.4 F (36.3 C)   Resp 16   Ht 5\' 4"  (1.626 m)   Wt 182 lb (82.6 kg)   SpO2 96%   BMI 31.24 kg/m  Wt Readings from Last 3 Encounters:  10/23/20 182 lb (82.6 kg)  06/19/20 183 lb (83 kg)  03/22/20 180 lb (81.6 kg)     Health Maintenance Due  Topic Date Due   Hepatitis C Screening   Never done   Zoster Vaccines- Shingrix (1 of 2) Never done   PNA vac Low Risk Adult (2 of 2 - PCV13) 04/09/2018   COVID-19 Vaccine (4 - Booster for Moderna series) 09/27/2020    There are no preventive care reminders to display for this patient.  Lab Results  Component Value Date   TSH 1.030 02/17/2020   Lab Results  Component Value Date   WBC 7.3 06/19/2020   HGB 14.4 06/19/2020   HCT 43.4 06/19/2020   MCV 90 06/19/2020   PLT 252 06/19/2020   Lab Results  Component Value Date   NA 132 (L) 06/26/2020   K 5.3 (H) 06/26/2020   CO2 21 06/26/2020   GLUCOSE 157 (H) 06/26/2020   BUN 14 06/26/2020   CREATININE 1.12 06/26/2020   BILITOT <0.2 06/26/2020   ALKPHOS 46 06/26/2020   AST 15 06/26/2020   ALT 18 06/26/2020   PROT 7.0 06/26/2020   ALBUMIN 4.5 06/26/2020   CALCIUM 9.8 06/26/2020   Lab Results  Component Value Date   CHOL 150 06/19/2020   Lab Results  Component Value Date   HDL 56 06/19/2020   Lab Results  Component Value Date   LDLCALC 70 06/19/2020   Lab Results  Component Value Date   TRIG 137 06/19/2020   Lab Results  Component Value Date   CHOLHDL 2.7 06/19/2020   Lab Results  Component Value Date   HGBA1C 7.1 (H) 06/19/2020      Assessment & Plan:   Problem List Items Addressed This Visit       Cardiovascular and Mediastinum   Essential hypertension - Primary   Relevant Orders   Comprehensive metabolic panel   CBC with Differential/Platelet An individual hypertension care plan was established and reinforced today.  The  patient's status was assessed using clinical findings on exam and labs or diagnostic tests. The patient's success at meeting treatment goals on disease specific evidence-based guidelines and found to be well controlled. SELF MANAGEMENT: The patient and I together assessed ways to personally work towards obtaining the recommended goals. RECOMMENDATIONS: avoid decongestants found in common cold remedies, decrease consumption  of alcohol, perform routine monitoring of BP with home BP cuff, exercise, reduction of dietary salt, take medicines as prescribed, try not to miss doses and quit smoking.  Regular exercise and maintaining a healthy weight is needed.  Stress reduction may help. A CLINICAL SUMMARY including written plan identify barriers to care unique to individual due to social or financial issues.  We attempt to mutually creat solutions for individual and family understanding.     Coronary artery disease involving native coronary artery of native heart without angina pectoris An individual plan was formulated based on patient history and exam, labs and evidence based data. Patient has not had recent angina or nitroglycerin use. continue present treatment.      Digestive   Reflux esophagitis (Chronic) Plan of care was formulated today.  She is doing well.  A plan of care was formulated using patient exam, tests and other sources to optimize care using evidence based information.  Recommend no smoking, no eating after supper, avoid fatty foods, elevate Head of bed, avoid tight fitting clothing.  Continue on omeprazole.      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  The patient's status was assessed using clinical findings on exam, labs and diagnostic testing. Patient success at meeting goals based on disease specific evidence-based guidelines and found to be good controlled. Medications were assessed and patient's understanding of the medical issues , including barriers were assessed. Recommend adherence to a diabetic diet, a graduated exercise program, HgbA1c level is checked quarterly, and urine microalbumin performed yearly .  Annual mono-filament sensation testing performed. Lower blood pressure and control hyperlipidemia is important. Get annual eye exams and annual flu shots and smoking  cessation discussed.  Self management goals were discussed.      Genitourinary   Enlarged prostate with urinary obstruction (Chronic)   Relevant Orders   PSA AN INDIVIDUAL CARE PLAN for BPH was established and reinforced today.  The patient's status was assessed using clinical findings on exam, labs, and other diagnostic testing. Patient's success at meeting treatment goals based on disease specific evidence-bassed guidelines and found to be in good control. RECOMMENDATIONS include maintain present medicines and treatment.      Other   Mixed hyperlipidemia   Relevant Orders   Lipid panel AN INDIVIDUAL CARE PLAN for hyperlipidemia/ cholesterol was established and reinforced today.  The patient's status was assessed using clinical findings on exam, lab and other diagnostic tests. The patient's disease status was assessed based on evidence-based guidelines and found to be fair controlled. MEDICATIONS were reviewed. SELF MANAGEMENT GOALS have been discussed and patient's success at attaining the goal of low cholesterol was assessed. RECOMMENDATION given include regular exercise 3 days a week and low cholesterol/low fat diet. CLINICAL SUMMARY including written plan to identify barriers unique to the patient due to social or economic  reasons was discussed.    Other Visit Diagnoses     Obesity, diabetes, and hypertension syndrome ([place])       Relevant Orders   POCT UA - Microalbumin   Hemoglobin  A1c An individual care plan for diabetes was established and reinforced today.  The patient's status was assessed using clinical findings on exam, labs and diagnostic testing. Patient success at meeting goals based on disease specific evidence-based guidelines and found to be good controlled. Medications were assessed and patient's understanding of the medical issues , including barriers were assessed. Recommend adherence to a diabetic diet, a graduated exercise program, HgbA1c level is checked quarterly,  and urine microalbumin performed yearly .  Annual mono-filament sensation testing performed. Lower blood pressure and control hyperlipidemia is important. Get annual eye exams and annual flu shots and smoking cessation discussed.  Self management goals were discussed.      Atherosclerosis of native coronary artery of native heart without angina pectoris    An individual plan was formulated based on patient history and exam, labs and evidence based data. Patient has not had recent angina or nitroglycerin use. continue present treatment.           Follow-up: Return in about 4 months (around 02/22/2021) for fasting.    Reinaldo Meeker, MD

## 2020-10-24 LAB — COMPREHENSIVE METABOLIC PANEL
ALT: 13 IU/L (ref 0–44)
AST: 8 IU/L (ref 0–40)
Albumin/Globulin Ratio: 1.9 (ref 1.2–2.2)
Albumin: 4.5 g/dL (ref 3.7–4.7)
Alkaline Phosphatase: 42 IU/L — ABNORMAL LOW (ref 44–121)
BUN/Creatinine Ratio: 13 (ref 10–24)
BUN: 15 mg/dL (ref 8–27)
Bilirubin Total: 0.3 mg/dL (ref 0.0–1.2)
CO2: 21 mmol/L (ref 20–29)
Calcium: 9.6 mg/dL (ref 8.6–10.2)
Chloride: 97 mmol/L (ref 96–106)
Creatinine, Ser: 1.17 mg/dL (ref 0.76–1.27)
Globulin, Total: 2.4 g/dL (ref 1.5–4.5)
Glucose: 129 mg/dL — ABNORMAL HIGH (ref 65–99)
Potassium: 5.2 mmol/L (ref 3.5–5.2)
Sodium: 133 mmol/L — ABNORMAL LOW (ref 134–144)
Total Protein: 6.9 g/dL (ref 6.0–8.5)
eGFR: 66 mL/min/{1.73_m2} (ref >59)

## 2020-10-24 LAB — CBC WITH DIFFERENTIAL/PLATELET
Basophils Absolute: 0 10*3/uL (ref 0.0–0.2)
Basos: 0 %
EOS (ABSOLUTE): 0.2 10*3/uL (ref 0.0–0.4)
Eos: 3 %
Hematocrit: 41.1 % (ref 37.5–51.0)
Hemoglobin: 13.2 g/dL (ref 13.0–17.7)
Immature Grans (Abs): 0 10*3/uL (ref 0.0–0.1)
Immature Granulocytes: 0 %
Lymphocytes Absolute: 2.1 10*3/uL (ref 0.7–3.1)
Lymphs: 27 %
MCH: 29.1 pg (ref 26.6–33.0)
MCHC: 32.1 g/dL (ref 31.5–35.7)
MCV: 91 fL (ref 79–97)
Monocytes Absolute: 0.8 10*3/uL (ref 0.1–0.9)
Monocytes: 11 %
Neutrophils Absolute: 4.5 10*3/uL (ref 1.4–7.0)
Neutrophils: 59 %
Platelets: 216 10*3/uL (ref 150–450)
RBC: 4.54 x10E6/uL (ref 4.14–5.80)
RDW: 13.1 % (ref 11.6–15.4)
WBC: 7.8 10*3/uL (ref 3.4–10.8)

## 2020-10-24 LAB — LIPID PANEL
Chol/HDL Ratio: 2.3 ratio (ref 0.0–5.0)
Cholesterol, Total: 134 mg/dL (ref 100–199)
HDL: 58 mg/dL (ref >39)
LDL Chol Calc (NIH): 61 mg/dL (ref 0–99)
Triglycerides: 75 mg/dL (ref 0–149)
VLDL Cholesterol Cal: 15 mg/dL (ref 5–40)

## 2020-10-24 LAB — PSA: Prostate Specific Ag, Serum: 0.4 ng/mL (ref 0.0–4.0)

## 2020-10-24 LAB — HEMOGLOBIN A1C
Est. average glucose Bld gHb Est-mCnc: 154 mg/dL
Hgb A1c MFr Bld: 7 % — ABNORMAL HIGH (ref 4.8–5.6)

## 2020-10-24 LAB — CARDIOVASCULAR RISK ASSESSMENT

## 2020-10-24 NOTE — Progress Notes (Signed)
Glucose 129, kidney tests normal, liver tests normal, Cholesterol normal, CBC normal, PS 0.4 normal, A1c 7.0 good lp

## 2021-01-01 DIAGNOSIS — E119 Type 2 diabetes mellitus without complications: Secondary | ICD-10-CM | POA: Diagnosis not present

## 2021-01-08 ENCOUNTER — Ambulatory Visit: Payer: Medicare Other | Admitting: Cardiology

## 2021-01-08 ENCOUNTER — Other Ambulatory Visit: Payer: Self-pay

## 2021-01-08 ENCOUNTER — Encounter: Payer: Self-pay | Admitting: Cardiology

## 2021-01-08 VITALS — BP 138/72 | HR 68 | Ht 63.0 in | Wt 179.4 lb

## 2021-01-08 DIAGNOSIS — I10 Essential (primary) hypertension: Secondary | ICD-10-CM

## 2021-01-08 DIAGNOSIS — I2510 Atherosclerotic heart disease of native coronary artery without angina pectoris: Secondary | ICD-10-CM

## 2021-01-08 DIAGNOSIS — Z6831 Body mass index (BMI) 31.0-31.9, adult: Secondary | ICD-10-CM

## 2021-01-08 DIAGNOSIS — E782 Mixed hyperlipidemia: Secondary | ICD-10-CM

## 2021-01-08 NOTE — Progress Notes (Signed)
Cardiology Office Note:    Date:  01/08/2021   ID:  Matei, Coomer 14-Feb-1946, MRN [id number]  PCP:  Lillard Anes, MD  Cardiologist:  Mertie Moores, MD  Electrophysiologist:  None   Referring MD: Lillard Anes   I am doing well  History of Present Illness:    Lance Riddle is a 75 y.o. male with a hx of mild nonobstructive CAD, hypertension, hyperlipidemia is here today for follow-up visit.  The patient was last seen by Dr. Acie Fredrickson in September 2021 at that time he appeared to be doing well from our cardiovascular standpoint.  He offers no complaints today.  Past Medical History:  Diagnosis Date   Adjustment disorder with depressed mood 06/16/2019   BMI 31.0-31.9,adult 10/14/2019   Combined hyperlipidemia associated with type 2 diabetes mellitus ([place]) 06/16/2019   Coronary artery disease involving native coronary artery of native heart without angina pectoris 12/19/2009   Qualifier: Diagnosis of  By: Orville Govern, CMA, Carol     Coronary atherosclerosis of native coronary artery    2007 catheterization D1 70%, ramus intermediate 40%, large RV branch off the right coronary artery 60-70%.; Lexiscan Myoview (03/2013):  No ischemia, EF 58% (normal study).   Enlarged prostate with urinary obstruction 06/16/2019   Essential hypertension 12/30/2008   Qualifier: Diagnosis of  By: Orville Govern, CMA, Carol     Hyperlipidemia    Mixed hyperlipidemia 12/30/2008   Qualifier: Diagnosis of  By: Orville Govern, CMA, Carol     Reflux esophagitis 06/16/2019   Routine general medical examination at a health care facility 03/22/2020    Past Surgical History:  Procedure Laterality Date   ANGIOPLASTY  1994   CARDIAC SURGERY     TONSILLECTOMY AND ADENOIDECTOMY      Current Medications: Current Meds  Medication Sig   amLODipine (NORVASC) 10 MG tablet TAKE 1 TABLET BY MOUTH ONCE DAILY   aspirin 81 MG tablet Take 81 mg by mouth daily.   finasteride (PROSCAR) 5 MG tablet TAKE 1 TABLET BY MOUTH  DAILY    glimepiride (AMARYL) 4 MG tablet TAKE 1 TABLET BY MOUTH  DAILY   lisinopril-hydrochlorothiazide (ZESTORETIC) 20-12.5 MG tablet TAKE 1 TABLET BY MOUTH  DAILY   metFORMIN (GLUCOPHAGE) 500 MG tablet TAKE 1 TABLET BY MOUTH  TWICE DAILY   nitroGLYCERIN (NITROSTAT) 0.4 MG SL tablet Place 1 tablet (0.4 mg total) under the tongue every 5 (five) minutes as needed for chest pain.   omeprazole (PRILOSEC) 20 MG capsule TAKE 1 CAPSULE BY MOUTH  DAILY   simvastatin (ZOCOR) 40 MG tablet TAKE 1 TABLET BY MOUTH ONCE DAILY   tamsulosin (FLOMAX) 0.4 MG CAPS capsule TAKE 1 CAPSULE BY MOUTH  ONCE DAILY     Allergies:   Patient has no known allergies.   Social History   Socioeconomic History   Marital status: Married    Spouse name: Not on file   Number of children: 0   Years of education: Not on file   Highest education level: Not on file  Occupational History   Occupation: farmer  Tobacco Use   Smoking status: Former    Types: Cigarettes    Quit date: 1994    Years since quitting: 28.6   Smokeless tobacco: Current    Types: Chew   Tobacco comments:    1 can a week  Substance and Sexual Activity   Alcohol use: Not Currently   Drug use: Not Currently   Sexual activity: Not Currently  Other Topics Concern  Not on file  Social History Narrative   Not on file   Social Determinants of Health   Financial Resource Strain: Low Risk    Difficulty of Paying Living Expenses: Not hard at all  Food Insecurity: No Food Insecurity   Worried About Charity fundraiser in the Last Year: Never true   Arboriculturist in the Last Year: Never true  Transportation Needs: No Transportation Needs   Lack of Transportation (Medical): No   Lack of Transportation (Non-Medical): No  Physical Activity: Sufficiently Active   Days of Exercise per Week: 3 days   Minutes of Exercise per Session: 60 min  Stress: No Stress Concern Present   Feeling of Stress : Not at all  Social Connections: Moderately Isolated    Frequency of Communication with Friends and Family: Twice a week   Frequency of Social Gatherings with Friends and Family: Twice a week   Attends Religious Services: More than 4 times per year   Active Member of [employer] or Organizations: No   Attends Archivist Meetings: Never   Marital Status: Widowed     Family History: The patient's family history includes Cancer in his brother; Coronary artery disease in his mother and another family member; Diabetes in his brother and father; Drug abuse in his brother; Heart attack in his mother; Hypertension in his father; Lung cancer in his father.  ROS:   Review of Systems  Constitution: Negative for decreased appetite, fever and weight gain.  HENT: Negative for congestion, ear discharge, hoarse voice and sore throat.   Eyes: Negative for discharge, redness, vision loss in right eye and visual halos.  Cardiovascular: Negative for chest pain, dyspnea on exertion, leg swelling, orthopnea and palpitations.  Respiratory: Negative for cough, hemoptysis, shortness of breath and snoring.   Endocrine: Negative for heat intolerance and polyphagia.  Hematologic/Lymphatic: Negative for bleeding problem. Does not bruise/bleed easily.  Skin: Negative for flushing, nail changes, rash and suspicious lesions.  Musculoskeletal: Negative for arthritis, joint pain, muscle cramps, myalgias, neck pain and stiffness.  Gastrointestinal: Negative for abdominal pain, bowel incontinence, diarrhea and excessive appetite.  Genitourinary: Negative for decreased libido, genital sores and incomplete emptying.  Neurological: Negative for brief paralysis, focal weakness, headaches and loss of balance.  Psychiatric/Behavioral: Negative for altered mental status, depression and suicidal ideas.  Allergic/Immunologic: Negative for HIV exposure and persistent infections.    EKGs/Labs/Other Studies Reviewed:    The following studies were reviewed today:   EKG:  The ekg  ordered today demonstrates sinus rhythm, heart rate 68 bpm.  Recent Labs: 02/17/2020: TSH 1.030 10/23/2020: ALT 13; BUN 15; Creatinine, Ser 1.17; Hemoglobin 13.2; Platelets 216; Potassium 5.2; Sodium 133  Recent Lipid Panel    Component Value Date/Time   CHOL 134 10/23/2020 0836   TRIG 75 10/23/2020 0836   HDL 58 10/23/2020 0836   CHOLHDL 2.3 10/23/2020 0836   CHOLHDL 1.8 11/26/2015 1511   VLDL 12 11/26/2015 1511   LDLCALC 61 10/23/2020 0836    Physical Exam:    VS:  BP 138/72   Pulse 68   Ht '5\' 3"'$  (1.6 m)   Wt 179 lb 6.4 oz (81.4 kg)   SpO2 96%   BMI 31.78 kg/m     Wt Readings from Last 3 Encounters:  01/08/21 179 lb 6.4 oz (81.4 kg)  10/23/20 182 lb (82.6 kg)  06/19/20 183 lb (83 kg)     GEN: Well nourished, well developed in no acute distress HEENT:  Normal NECK: No JVD; No carotid bruits LYMPHATICS: No lymphadenopathy CARDIAC: S1S2 noted,RRR, no murmurs, rubs, gallops RESPIRATORY:  Clear to auscultation without rales, wheezing or rhonchi  ABDOMEN: Soft, non-tender, non-distended, +bowel sounds, no guarding. EXTREMITIES: No edema, No cyanosis, no clubbing MUSCULOSKELETAL:  No deformity  SKIN: Warm and dry NEUROLOGIC:  Alert and oriented x 3, non-focal PSYCHIATRIC:  Normal affect, good insight  ASSESSMENT:    1. Coronary artery disease involving native coronary artery of native heart without angina pectoris   2. Essential hypertension   3. Mixed hyperlipidemia   4. BMI 31.0-31.9,adult    PLAN:     He appears to be doing well from a cardiovascular standpoint.  He has had refills on his medication at this time he does not need any.  No angina symptoms.  No further testing needs to be performed.  Blood pressure is acceptable, continue with current antihypertensive regimen.  Hyperlipidemia - continue with current statin medication.  The patient understands the need to lose weight with diet and exercise. We have discussed specific strategies for this.  The  patient is in agreement with the above plan. The patient left the office in stable condition.  The patient will follow up in 1 year with Dr. Geraldo Pitter.   Medication Adjustments/Labs and Tests Ordered: Current medicines are reviewed at length with the patient today.  Concerns regarding medicines are outlined above.  Orders Placed This Encounter  Procedures   EKG 12-Lead   No orders of the defined types were placed in this encounter.   Patient Instructions  Medication Instructions:  No medication changes. *If you need a refill on your cardiac medications before your next appointment, please call your pharmacy*   Lab Work: None ordered If you have labs (blood work) drawn today and your tests are completely normal, you will receive your results only by: [facility] [place] (if you have MyChart) OR A paper copy in the mail If you have any lab test that is abnormal or we need to change your treatment, we will call you to review the results.   Testing/Procedures: None ordered   Follow-Up: At [facility], you and your health needs are our priority.  As part of our continuing mission to provide you with exceptional heart care, we have created designated Provider Care Teams.  These Care Teams include your primary Cardiologist (physician) and Advanced Practice Providers (APPs -  Physician Assistants and Nurse Practitioners) who all work together to provide you with the care you need, when you need it.  We recommend signing up for the patient portal called "MyChart".  Sign up information is provided on this After Visit Summary.  MyChart is used to connect with patients for Virtual Visits (Telemedicine).  Patients are able to view lab/test results, encounter notes, upcoming appointments, etc.  Non-urgent messages can be sent to your provider as well.   To learn more about what you can do with MyChart, go to [email or web address].    Your next appointment:   12 month(s)  The format for your  next appointment:   In Person  Provider:   Jyl Heinz, MD   Other Instructions NA   Adopting a Healthy Lifestyle.  Know what a healthy weight is for you (roughly BMI <25) and aim to maintain this   Aim for 7+ servings of fruits and vegetables daily   65-80+ fluid ounces of water or unsweet tea for healthy kidneys   Limit to max 1 drink of alcohol per day; avoid smoking/tobacco  Limit animal fats in diet for cholesterol and heart health - choose grass fed whenever available   Avoid highly processed foods, and foods high in saturated/trans fats   Aim for low stress - take time to unwind and care for your mental health   Aim for 150 min of moderate intensity exercise weekly for heart health, and weights twice weekly for bone health   Aim for 7-9 hours of sleep daily   When it comes to diets, agreement about the perfect plan isnt easy to find, even among the experts. Experts at the [place] developed an idea known as the Healthy Eating Plate. Just imagine a plate divided into logical, healthy portions.   The emphasis is on diet quality:   Load up on vegetables and fruits - one-half of your plate: Aim for color and variety, and remember that potatoes dont count.   Go for whole grains - one-quarter of your plate: Whole wheat, barley, wheat berries, quinoa, oats, brown rice, and foods made with them. If you want pasta, go with whole wheat pasta.   Protein power - one-quarter of your plate: Fish, chicken, beans, and nuts are all healthy, versatile protein sources. Limit red meat.   The diet, however, does go beyond the plate, offering a few other suggestions.   Use healthy plant oils, such as olive, canola, soy, corn, sunflower and peanut. Check the labels, and avoid partially hydrogenated oil, which have unhealthy trans fats.   If youre thirsty, drink water. Coffee and tea are good in moderation, but skip sugary drinks and limit milk and dairy  products to one or two daily servings.   The type of carbohydrate in the diet is more important than the amount. Some sources of carbohydrates, such as vegetables, fruits, whole grains, and beans-are healthier than others.   Finally, stay active  Signed, Berniece Salines, DO  01/08/2021 8:22 AM    [place]

## 2021-01-08 NOTE — Patient Instructions (Signed)
Medication Instructions:  No medication changes. *If you need a refill on your cardiac medications before your next appointment, please call your pharmacy*   Lab Work: None ordered If you have labs (blood work) drawn today and your tests are completely normal, you will receive your results only by: [place] (if you have MyChart) OR A paper copy in the mail If you have any lab test that is abnormal or we need to change your treatment, we will call you to review the results.   Testing/Procedures: None ordered   Follow-Up: At [facility], you and your health needs are our priority.  As part of our continuing mission to provide you with exceptional heart care, we have created designated Provider Care Teams.  These Care Teams include your primary Cardiologist (physician) and Advanced Practice Providers (APPs -  Physician Assistants and Nurse Practitioners) who all work together to provide you with the care you need, when you need it.  We recommend signing up for the patient portal called "MyChart".  Sign up information is provided on this After Visit Summary.  MyChart is used to connect with patients for Virtual Visits (Telemedicine).  Patients are able to view lab/test results, encounter notes, upcoming appointments, etc.  Non-urgent messages can be sent to your provider as well.   To learn more about what you can do with MyChart, go to [email or web address].    Your next appointment:   12 month(s)  The format for your next appointment:   In Person  Provider:   Jyl Heinz, MD   Other Instructions NA

## 2021-02-06 DIAGNOSIS — E119 Type 2 diabetes mellitus without complications: Secondary | ICD-10-CM | POA: Diagnosis not present

## 2021-02-06 DIAGNOSIS — H2513 Age-related nuclear cataract, bilateral: Secondary | ICD-10-CM | POA: Diagnosis not present

## 2021-02-06 DIAGNOSIS — Z7984 Long term (current) use of oral hypoglycemic drugs: Secondary | ICD-10-CM | POA: Diagnosis not present

## 2021-02-06 LAB — HM DIABETES EYE EXAM

## 2021-02-25 ENCOUNTER — Ambulatory Visit: Payer: Medicare Other | Admitting: Legal Medicine

## 2021-03-11 ENCOUNTER — Other Ambulatory Visit: Payer: Self-pay | Admitting: Legal Medicine

## 2021-03-11 DIAGNOSIS — K2100 Gastro-esophageal reflux disease with esophagitis, without bleeding: Secondary | ICD-10-CM

## 2021-03-12 NOTE — Telephone Encounter (Signed)
Refill sent to pharmacy.   

## 2021-03-13 ENCOUNTER — Telehealth: Payer: Self-pay | Admitting: *Deleted

## 2021-03-13 NOTE — Chronic Care Management (AMB) (Signed)
  Chronic Care Management   Outreach Note  03/13/2021 Name: Lance Riddle MRN: [id number] DOB: January 20, 1946  Lance Riddle is a 75 y.o. year old male who is a primary care patient of Lillard Anes, MD. I reached out to Lance Riddle by phone today in response to a referral sent by [name]'s primary care provider.  An unsuccessful telephone outreach was attempted today. The patient was referred to the case management team for assistance with care management and care coordination.   Follow Up Plan: A HIPAA compliant phone message was left for the patient providing contact information and requesting a return call. The care management team will reach out to the patient again over the next 7 days.  If patient returns call to provider office, please advise to call [place] at [phone].  [facility]  Direct Dial: [phone]

## 2021-03-18 ENCOUNTER — Other Ambulatory Visit: Payer: Self-pay

## 2021-03-18 ENCOUNTER — Ambulatory Visit (INDEPENDENT_AMBULATORY_CARE_PROVIDER_SITE_OTHER): Payer: Medicare Other | Admitting: Legal Medicine

## 2021-03-18 ENCOUNTER — Other Ambulatory Visit: Payer: Self-pay | Admitting: Legal Medicine

## 2021-03-18 ENCOUNTER — Encounter: Payer: Self-pay | Admitting: Legal Medicine

## 2021-03-18 VITALS — BP 120/60 | HR 73 | Temp 98.0°F | Ht 63.0 in | Wt 179.0 lb

## 2021-03-18 DIAGNOSIS — I10 Essential (primary) hypertension: Secondary | ICD-10-CM | POA: Diagnosis not present

## 2021-03-18 DIAGNOSIS — I2510 Atherosclerotic heart disease of native coronary artery without angina pectoris: Secondary | ICD-10-CM

## 2021-03-18 DIAGNOSIS — E1169 Type 2 diabetes mellitus with other specified complication: Secondary | ICD-10-CM

## 2021-03-18 DIAGNOSIS — Z23 Encounter for immunization: Secondary | ICD-10-CM | POA: Diagnosis not present

## 2021-03-18 DIAGNOSIS — E782 Mixed hyperlipidemia: Secondary | ICD-10-CM

## 2021-03-18 DIAGNOSIS — N138 Other obstructive and reflux uropathy: Secondary | ICD-10-CM | POA: Diagnosis not present

## 2021-03-18 DIAGNOSIS — K5909 Other constipation: Secondary | ICD-10-CM | POA: Diagnosis not present

## 2021-03-18 DIAGNOSIS — N401 Enlarged prostate with lower urinary tract symptoms: Secondary | ICD-10-CM

## 2021-03-18 MED ORDER — LINACLOTIDE 72 MCG PO CAPS: 72.0000 ug | ORAL_CAPSULE | Freq: Every day | ORAL | 6 refills | Status: DC

## 2021-03-18 NOTE — Progress Notes (Addendum)
Subjective:  Patient ID: Lance Riddle, male    DOB: 1945/08/17  Age: 75 y.o. MRN: [id number]  Chief Complaint  Patient presents with   Diabetes   Hyperlipidemia   Hypertension    HPI: chronic visit  Patient present with type 2 diabetes.  Specifically, this is type 2, noninsulin requiring diabetes, complicated by hypertesnion and hypercholesterolemia.  Compliance with treatment has been good; patient take medicines as directed, maintains diet and exercise regimen, follows up as directed, and is keeping glucose diary.  Date of  diagnosis 2010.  Depression screen has been performed.Tobacco screen nonsmoker. Current medicines for diabetes glimipiride, metformin.  Patient is on lisinopril for renal protection and simvastatin for cholesterol control.  Patient performs foot exams daily and last ophthalmologic exam was eyes done.   Patient presents for follow up of hypertension.  Patient tolerating lisinopril/HCTZ well with side effects.  Patient was diagnosed with hypertension 2010 so has been treated for hypertension for 10 years.Patient is working on maintaining diet and exercise regimen and follows up as directed. Complication include none.   Patient presents with hyperlipidemia.  Compliance with treatment has been good; patient takes medicines as directed, maintains low cholesterol diet, follows up as directed, and maintains exercise regimen.  Patient is using simvastatin without problems.      Current Outpatient Medications on File Prior to Visit  Medication Sig Dispense Refill   aspirin 81 MG tablet Take 81 mg by mouth daily.     finasteride (PROSCAR) 5 MG tablet TAKE 1 TABLET BY MOUTH  DAILY 90 tablet 2   glimepiride (AMARYL) 4 MG tablet TAKE 1 TABLET BY MOUTH  DAILY 90 tablet 2   lisinopril-hydrochlorothiazide (ZESTORETIC) 20-12.5 MG tablet TAKE 1 TABLET BY MOUTH  DAILY 90 tablet 2   metFORMIN (GLUCOPHAGE) 500 MG tablet TAKE 1 TABLET BY MOUTH  TWICE DAILY 180 tablet 2   nitroGLYCERIN  (NITROSTAT) 0.4 MG SL tablet Place 1 tablet (0.4 mg total) under the tongue every 5 (five) minutes as needed for chest pain. 25 tablet 3   omeprazole (PRILOSEC) 20 MG capsule TAKE 1 CAPSULE BY MOUTH  DAILY 90 capsule 3   tamsulosin (FLOMAX) 0.4 MG CAPS capsule TAKE 1 CAPSULE BY MOUTH  ONCE DAILY 90 capsule 2   No current facility-administered medications on file prior to visit.   Past Medical History:  Diagnosis Date   Adjustment disorder with depressed mood 06/16/2019   BMI 31.0-31.9,adult 10/14/2019   Combined hyperlipidemia associated with type 2 diabetes mellitus ([place]) 06/16/2019   Coronary artery disease involving native coronary artery of native heart without angina pectoris 12/19/2009   Qualifier: Diagnosis of  By: Orville Govern, CMA, Carol     Coronary atherosclerosis of native coronary artery    2007 catheterization D1 70%, ramus intermediate 40%, large RV branch off the right coronary artery 60-70%.; Lexiscan Myoview (03/2013):  No ischemia, EF 58% (normal study).   Enlarged prostate with urinary obstruction 06/16/2019   Essential hypertension 12/30/2008   Qualifier: Diagnosis of  By: Orville Govern, CMA, Carol     Hyperlipidemia    Mixed hyperlipidemia 12/30/2008   Qualifier: Diagnosis of  By: Orville Govern, CMA, Carol     Reflux esophagitis 06/16/2019   Routine general medical examination at a health care facility 03/22/2020   Past Surgical History:  Procedure Laterality Date   ANGIOPLASTY  1994   CARDIAC SURGERY     TONSILLECTOMY AND ADENOIDECTOMY      Family History  Problem Relation Age of Onset  Heart attack Mother    Coronary artery disease Mother    Lung cancer Father    Diabetes Father    Hypertension Father    Cancer Brother    Coronary artery disease Other        paternal side of family   Diabetes Brother    Drug abuse Brother    Social History   Socioeconomic History   Marital status: Married    Spouse name: Not on file   Number of children: 0   Years of education: Not on file    Highest education level: Not on file  Occupational History   Occupation: farmer  Tobacco Use   Smoking status: Former    Types: Cigarettes    Quit date: 1994    Years since quitting: 28.8   Smokeless tobacco: Current    Types: Chew   Tobacco comments:    1 can a week  Substance and Sexual Activity   Alcohol use: Not Currently   Drug use: Not Currently   Sexual activity: Not Currently  Other Topics Concern   Not on file  Social History Narrative   Not on file   Social Determinants of Health   Financial Resource Strain: Low Risk    Difficulty of Paying Living Expenses: Not hard at all  Food Insecurity: No Food Insecurity   Worried About Charity fundraiser in the Last Year: Never true   [place] in the Last Year: Never true  Transportation Needs: No Transportation Needs   Lack of Transportation (Medical): No   Lack of Transportation (Non-Medical): No  Physical Activity: Sufficiently Active   Days of Exercise per Week: 3 days   Minutes of Exercise per Session: 60 min  Stress: No Stress Concern Present   Feeling of Stress : Not at all  Social Connections: Moderately Isolated   Frequency of Communication with Friends and Family: Twice a week   Frequency of Social Gatherings with Friends and Family: Twice a week   Attends Religious Services: More than 4 times per year   Active Member of [employer] or Organizations: No   Attends Archivist Meetings: Never   Marital Status: Widowed    Review of Systems  Constitutional:  Negative for chills, fatigue and fever.  HENT:  Positive for congestion. Negative for ear pain, sinus pain and sore throat.   Respiratory:  Positive for cough. Negative for shortness of breath.   Cardiovascular:  Negative for chest pain.  Gastrointestinal:  Positive for constipation. Negative for abdominal pain, diarrhea, nausea and vomiting.  Endocrine: Negative for polydipsia, polyphagia and polyuria.  Genitourinary:  Negative for dysuria.   Musculoskeletal:  Negative for myalgias.  Neurological:  Negative for dizziness, numbness and headaches.    Objective:  BP 120/60   Pulse 73   Temp 98 F (36.7 C)   Ht 5\' 3"  (1.6 m)   Wt 179 lb (81.2 kg)   SpO2 98%   BMI 31.71 kg/m   BP/Weight 03/18/2021 01/08/2021 4/78/2956  Systolic BP 213 086 578  Diastolic BP 60 72 50  Wt. (Lbs) 179 179.4 182  BMI 31.71 31.78 31.24    Physical Exam Vitals reviewed.  Constitutional:      General: He is not in acute distress.    Appearance: Normal appearance.  HENT:     Right Ear: Tympanic membrane normal.     Left Ear: Tympanic membrane normal.     Nose: Nose normal.     Mouth/Throat:  Mouth: Mucous membranes are moist.  Eyes:     Extraocular Movements: Extraocular movements intact.     Conjunctiva/sclera: Conjunctivae normal.     Pupils: Pupils are equal, round, and reactive to light.  Cardiovascular:     Rate and Rhythm: Normal rate and regular rhythm.     Pulses: Normal pulses.     Heart sounds: Normal heart sounds. No murmur heard.   No gallop.  Pulmonary:     Effort: Pulmonary effort is normal. No respiratory distress.     Breath sounds: Normal breath sounds. No wheezing.  Abdominal:     General: Abdomen is flat. Bowel sounds are normal. There is no distension.     Palpations: Abdomen is soft.     Tenderness: There is no abdominal tenderness.  Musculoskeletal:        General: Normal range of motion.     Cervical back: Normal range of motion and neck supple.     Right lower leg: No edema.     Left lower leg: No edema.  Skin:    General: Skin is warm.     Capillary Refill: Capillary refill takes less than 2 seconds.  Neurological:     General: No focal deficit present.     Mental Status: He is alert and oriented to person, place, and time. Mental status is at baseline.    Diabetic Foot Exam - Simple   Simple Foot Form Diabetic Foot exam was performed with the following findings: Yes 03/18/2021  9:03 AM  Visual  Inspection No deformities, no ulcerations, no other skin breakdown bilaterally: Yes Sensation Testing Intact to touch and monofilament testing bilaterally: Yes Pulse Check Posterior Tibialis and Dorsalis pulse intact bilaterally: Yes Comments      Lab Results  Component Value Date   WBC 7.8 10/23/2020   HGB 13.2 10/23/2020   HCT 41.1 10/23/2020   PLT 216 10/23/2020   GLUCOSE 129 (H) 10/23/2020   CHOL 134 10/23/2020   TRIG 75 10/23/2020   HDL 58 10/23/2020   LDLCALC 61 10/23/2020   ALT 13 10/23/2020   AST 8 10/23/2020   NA 133 (L) 10/23/2020   K 5.2 10/23/2020   CL 97 10/23/2020   CREATININE 1.17 10/23/2020   BUN 15 10/23/2020   CO2 21 10/23/2020   TSH 1.030 02/17/2020   HGBA1C 7.0 (H) 10/23/2020   MICROALBUR 10 10/23/2020      Assessment & Plan:   Problem List Items Addressed This Visit       Cardiovascular and Mediastinum   Essential hypertension   Relevant Orders   Comprehensive metabolic panel   CBC with Differential/Platelet An individual hypertension care plan was established and reinforced today.  The patient's status was assessed using clinical findings on exam and labs or diagnostic tests. The patient's success at meeting treatment goals on disease specific evidence-based guidelines and found to be well controlled. SELF MANAGEMENT: The patient and I together assessed ways to personally work towards obtaining the recommended goals. RECOMMENDATIONS: avoid decongestants found in common cold remedies, decrease consumption of alcohol, perform routine monitoring of BP with home BP cuff, exercise, reduction of dietary salt, take medicines as prescribed, try not to miss doses and quit smoking.  Regular exercise and maintaining a healthy weight is needed.  Stress reduction may help. A CLINICAL SUMMARY including written plan identify barriers to care unique to individual due to social or financial issues.  We attempt to mutually creat solutions for individual and family  understanding.  Coronary artery disease involving native coronary artery of native heart without angina pectoris - Primary   Relevant Orders   Lipid panel An individual plan was formulated based on patient history and exam, labs and evidence based data. Patient has not had recent angina or nitroglycerin use. continue present treatment.      Endocrine   Combined hyperlipidemia associated with type 2 diabetes mellitus ([place]) (Chronic)   Relevant Orders   Hemoglobin A1c An individual care plan for diabetes was established and reinforced today.  The patient's status was assessed using clinical findings on exam, labs and diagnostic testing. Patient success at meeting goals based on disease specific evidence-based guidelines and found to be good controlled. Medications were assessed and patient's understanding of the medical issues , including barriers were assessed. Recommend adherence to a diabetic diet, a graduated exercise program, HgbA1c level is checked quarterly, and urine microalbumin performed yearly .  Annual mono-filament sensation testing performed. Lower blood pressure and control hyperlipidemia is important. Get annual eye exams and annual flu shots and smoking cessation discussed.  Self management goals were discussed.      Genitourinary   Enlarged prostate with urinary obstruction (Chronic)   Relevant Orders   PSA AN INDIVIDUAL CARE PLAN was established and reinforced today.  The patient's status was assessed using clinical findings on exam, labs, and other diagnostic testing. Patient's success at meeting treatment goals based on disease specific evidence-bassed guidelines and found to be in good control. RECOMMENDATIONS include maintaining present medicines and treatment.      Other   Mixed hyperlipidemia   Relevant Orders   Lipid panel AN INDIVIDUAL CARE PLAN for hyperlipidemia/ cholesterol was established and reinforced today.  The patient's status was assessed using clinical  findings on exam, lab and other diagnostic tests. The patient's disease status was assessed based on evidence-based guidelines and found to be well controlled. MEDICATIONS were reviewed. SELF MANAGEMENT GOALS have been discussed and patient's success at attaining the goal of low cholesterol was assessed. RECOMMENDATION given include regular exercise 3 days a week and low cholesterol/low fat diet. CLINICAL SUMMARY including written plan to identify barriers unique to the patient due to social or economic  reasons was discussed.    Other Visit Diagnoses     Chronic constipation       Relevant Medications   linaclotide (LINZESS) 72 MCG capsule AN INDIVIDUAL CARE PLAN was established and reinforced today.  The patient's status was assessed using clinical findings on exam, labs, and other diagnostic testing. Patient's success at meeting treatment goals based on disease specific evidence-bassed guidelines and found to be in good control. RECOMMENDATIONS include maintaining present medicines and treatment.    Need for immunization against influenza       Relevant Orders   Flu Vaccine QUAD High Dose(Fluad) (Completed)     .  Meds ordered this encounter  Medications   linaclotide (LINZESS) 72 MCG capsule    Sig: Take 1 capsule (72 mcg total) by mouth daily before breakfast.    Dispense:  30 capsule    Refill:  6     Orders Placed This Encounter  Procedures   Flu Vaccine QUAD High Dose(Fluad)   Comprehensive metabolic panel   Hemoglobin A1c   Lipid panel   PSA   CBC with Differential/Platelet    30 minute visit with review of old records  Follow-up: Return in about 4 months (around 07/16/2021) for fasting.  An After Visit Summary was printed and given to the patient.  Reinaldo Meeker, MD [facility] [phone]

## 2021-03-19 ENCOUNTER — Other Ambulatory Visit: Payer: Self-pay | Admitting: Legal Medicine

## 2021-03-19 ENCOUNTER — Telehealth: Payer: Self-pay

## 2021-03-19 LAB — HEMOGLOBIN A1C
Est. average glucose Bld gHb Est-mCnc: 143 mg/dL
Hgb A1c MFr Bld: 6.6 % — ABNORMAL HIGH (ref 4.8–5.6)

## 2021-03-19 LAB — CBC WITH DIFFERENTIAL/PLATELET
Basophils Absolute: 0 10*3/uL (ref 0.0–0.2)
Basos: 0 %
EOS (ABSOLUTE): 0.3 10*3/uL (ref 0.0–0.4)
Eos: 3 %
Hematocrit: 41.6 % (ref 37.5–51.0)
Hemoglobin: 13.4 g/dL (ref 13.0–17.7)
Immature Grans (Abs): 0.1 10*3/uL (ref 0.0–0.1)
Immature Granulocytes: 1 %
Lymphocytes Absolute: 2.4 10*3/uL (ref 0.7–3.1)
Lymphs: 26 %
MCH: 28.7 pg (ref 26.6–33.0)
MCHC: 32.2 g/dL (ref 31.5–35.7)
MCV: 89 fL (ref 79–97)
Monocytes Absolute: 1.6 10*3/uL — ABNORMAL HIGH (ref 0.1–0.9)
Monocytes: 18 %
Neutrophils Absolute: 4.9 10*3/uL (ref 1.4–7.0)
Neutrophils: 52 %
Platelets: 252 10*3/uL (ref 150–450)
RBC: 4.67 x10E6/uL (ref 4.14–5.80)
RDW: 13.4 % (ref 11.6–15.4)
WBC: 9.3 10*3/uL (ref 3.4–10.8)

## 2021-03-19 LAB — COMPREHENSIVE METABOLIC PANEL
ALT: 21 IU/L (ref 0–44)
AST: 16 IU/L (ref 0–40)
Albumin/Globulin Ratio: 1.7 (ref 1.2–2.2)
Albumin: 4.6 g/dL (ref 3.7–4.7)
Alkaline Phosphatase: 50 IU/L (ref 44–121)
BUN/Creatinine Ratio: 14 (ref 10–24)
BUN: 14 mg/dL (ref 8–27)
Bilirubin Total: 0.2 mg/dL (ref 0.0–1.2)
CO2: 24 mmol/L (ref 20–29)
Calcium: 9.8 mg/dL (ref 8.6–10.2)
Chloride: 95 mmol/L — ABNORMAL LOW (ref 96–106)
Creatinine, Ser: 1.02 mg/dL (ref 0.76–1.27)
Globulin, Total: 2.7 g/dL (ref 1.5–4.5)
Glucose: 122 mg/dL — ABNORMAL HIGH (ref 70–99)
Potassium: 5.3 mmol/L — ABNORMAL HIGH (ref 3.5–5.2)
Sodium: 131 mmol/L — ABNORMAL LOW (ref 134–144)
Total Protein: 7.3 g/dL (ref 6.0–8.5)
eGFR: 78 mL/min/{1.73_m2} (ref >59)

## 2021-03-19 LAB — LIPID PANEL
Chol/HDL Ratio: 2.4 ratio (ref 0.0–5.0)
Cholesterol, Total: 124 mg/dL (ref 100–199)
HDL: 52 mg/dL (ref >39)
LDL Chol Calc (NIH): 58 mg/dL (ref 0–99)
Triglycerides: 66 mg/dL (ref 0–149)
VLDL Cholesterol Cal: 14 mg/dL (ref 5–40)

## 2021-03-19 LAB — CARDIOVASCULAR RISK ASSESSMENT

## 2021-03-19 LAB — PSA: Prostate Specific Ag, Serum: 0.7 ng/mL (ref 0.0–4.0)

## 2021-03-19 MED ORDER — FEXOFENADINE-PSEUDOEPHED ER 60-120 MG PO TB12: 1.0000 | ORAL_TABLET | Freq: Two times a day (BID) | ORAL | 3 refills | Status: DC

## 2021-03-19 NOTE — Progress Notes (Signed)
Glucose 122, potassium remains high 5.3, liver tests normal, A1c 6.6 good, Cholesterol normal, PSA 0.7 good, cbc normal lp

## 2021-03-19 NOTE — Telephone Encounter (Signed)
Patient was informed.

## 2021-03-19 NOTE — Telephone Encounter (Signed)
Sent in [place] d lp

## 2021-03-19 NOTE — Telephone Encounter (Signed)
Patient called and stated he was seen yesterday and provider was going to call him something in for his congestion? Please advise?

## 2021-03-20 NOTE — Chronic Care Management (AMB) (Signed)
  Chronic Care Management   Note  03/20/2021 Name: Lance Riddle MRN: [id number] DOB: 1946-03-18  Lance Riddle is a 75 y.o. year old male who is a primary care patient of Lillard Anes, MD. I reached out to Raina Mina by phone today in response to a referral sent by Lance Riddle's PCP.  Lance Riddle was given information about Chronic Care Management services today including:  CCM service includes personalized support from designated clinical staff supervised by his physician, including individualized plan of care and coordination with other care providers 24/7 contact phone numbers for assistance for urgent and routine care needs. Service will only be billed when office clinical staff spend 20 minutes or more in a month to coordinate care. Only one practitioner may furnish and bill the service in a calendar month. The patient may stop CCM services at any time (effective at the end of the month) by phone call to the office staff. The patient is responsible for co-pay (up to 20% after annual deductible is met) if co-pay is required by the individual health plan.   Patient agreed to services and verbal consent obtained.   Follow up plan: Telephone appointment with care management team member scheduled for:04/16/21  [place]itro: [phone]

## 2021-04-08 DIAGNOSIS — E119 Type 2 diabetes mellitus without complications: Secondary | ICD-10-CM | POA: Diagnosis not present

## 2021-04-16 ENCOUNTER — Ambulatory Visit: Payer: Medicare Other

## 2021-04-16 VITALS — Ht 63.0 in | Wt 179.0 lb

## 2021-04-16 DIAGNOSIS — E119 Type 2 diabetes mellitus without complications: Secondary | ICD-10-CM

## 2021-04-16 DIAGNOSIS — I10 Essential (primary) hypertension: Secondary | ICD-10-CM

## 2021-04-16 DIAGNOSIS — E782 Mixed hyperlipidemia: Secondary | ICD-10-CM

## 2021-04-16 NOTE — Chronic Care Management (AMB) (Addendum)
Chronic Care Management   CCM RN Visit Note  04/16/2021 Name: Lance Riddle MRN: [id number] DOB: [birth year]  Subjective: Lance Riddle is a 75 y.o. year old male who is a primary care patient of Lillard Anes, MD. The care management team was consulted for assistance with disease management and care coordination needs.    Engaged with patient by telephone for initial visit in response to provider referral for case management and/or care coordination services.   Consent to Services:  The patient was given the following information about Chronic Care Management services today, agreed to services, and gave verbal consent: 1. CCM service includes personalized support from designated clinical staff supervised by the primary care provider, including individualized plan of care and coordination with other care providers 2. 24/7 contact phone numbers for assistance for urgent and routine care needs. 3. Service will only be billed when office clinical staff spend 20 minutes or more in a month to coordinate care. 4. Only one practitioner may furnish and bill the service in a calendar month. 5.The patient may stop CCM services at any time (effective at the end of the month) by phone call to the office staff. 6. The patient will be responsible for cost sharing (co-pay) of up to 20% of the service fee (after annual deductible is met). Patient agreed to services and consent obtained.  Patient agreed to services and verbal consent obtained.   Assessment: Review of patient past medical history, allergies, medications, health status, including review of consultants reports, laboratory and other test data, was performed as part of comprehensive evaluation and provision of chronic care management services.   SDOH (Social Determinants of Health) assessments and interventions performed:    CCM Care Plan  No Known Allergies  Outpatient Encounter Medications as of 04/16/2021  Medication Sig   amLODipine  (NORVASC) 10 MG tablet TAKE 1 TABLET BY MOUTH ONCE DAILY   aspirin 81 MG tablet Take 81 mg by mouth daily.   finasteride (PROSCAR) 5 MG tablet TAKE 1 TABLET BY MOUTH  DAILY   glimepiride (AMARYL) 4 MG tablet TAKE 1 TABLET BY MOUTH  DAILY   linaclotide (LINZESS) 72 MCG capsule Take 1 capsule (72 mcg total) by mouth daily before breakfast.   lisinopril-hydrochlorothiazide (ZESTORETIC) 20-12.5 MG tablet TAKE 1 TABLET BY MOUTH  DAILY   metFORMIN (GLUCOPHAGE) 500 MG tablet TAKE 1 TABLET BY MOUTH  TWICE DAILY   omeprazole (PRILOSEC) 20 MG capsule TAKE 1 CAPSULE BY MOUTH  DAILY   simvastatin (ZOCOR) 40 MG tablet TAKE 1 TABLET BY MOUTH ONCE DAILY   tamsulosin (FLOMAX) 0.4 MG CAPS capsule TAKE 1 CAPSULE BY MOUTH  ONCE DAILY   fexofenadine-pseudoephedrine (ALLEGRA-D ALLERGY & CONGESTION) 60-120 MG 12 hr tablet Take 1 tablet by mouth 2 (two) times daily. (Patient not taking: Reported on 04/16/2021)   nitroGLYCERIN (NITROSTAT) 0.4 MG SL tablet Place 1 tablet (0.4 mg total) under the tongue every 5 (five) minutes as needed for chest pain. (Patient not taking: Reported on 04/16/2021)   No facility-administered encounter medications on file as of 04/16/2021.    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 angina pectoris 12/19/2009   Mixed hyperlipidemia 12/30/2008  Essential hypertension 12/30/2008    Conditions to be addressed/monitored:HTN, HLD, and DMII  Care Plan : Diabetes Type 2 (Adult)  Updates made by Thana Ates, RN since 04/16/2021 12:00 AM     Problem: Disease Progression (Diabetes, Type 2)   Priority: Medium  Onset Date: 04/16/2021  Note:    Objective:  Lab Results  Component Value Date   HGBA1C 6.6 (H) 03/18/2021   Lab Results  Component Value Date   CREATININE 1.02 03/18/2021   CREATININE 1.17 10/23/2020   CREATININE 1.12 06/26/2020   Lab Results  Component Value Date   EGFR 78 03/18/2021   Current Barriers:  Patient with history of DM since 1994.  04/16/2021  Patient reports self monitoring daily.  Range 110-120. Recent A1c of 6.6.  Follows DM diet and takes medications as prescribed.   Case Manager Clinical Goal(s):  patient will demonstrate improved adherence to prescribed treatment plan for diabetes self care/management as evidenced by: daily monitoring and recording of CBG      contacting provider for new or worsened symptoms or questions      Interventions:  Collaboration with Lillard Anes, MD regarding development and update of comprehensive plan of care as evidenced by provider attestation and co-signature Inter-disciplinary care team collaboration (see longitudinal plan of car Provided education to patient about basic DM disease process Reviewed medications with patient and discussed importance of medication adherence Discussed plans with patient for ongoing care management follow up and provided patient with direct contact information for care management team Reviewed scheduled/upcoming provider appointments including: PCP, labs and this case manager Advised patient, providing education and rationale, to check cbg daily and record, calling MD  for findings outside established parameters.   Will mail AVS to patient with DM educations and low salt diet information Self-Care Activities/Patient Goals: - Self administers oral medications as prescribed Attends all scheduled provider appointments - check blood sugar at prescribed times - take the blood sugar meter to all doctor visits Follow Up Plan: Telephone follow up appointment with care management team member scheduled for:   07/18/2021    Care  Plan : Hypertension (Adult)  Updates made by Thana Ates, RN since 04/16/2021 12:00 AM     Problem: Disease Progression (Hypertension)      Problem: Resistant Hypertension (Hypertension)   Priority: Medium  Onset Date: 04/16/2021  Note:   Objective:  Last practice recorded BP readings:  BP Readings from Last 3 Encounters:  03/18/21 120/60  01/08/21 138/72  10/23/20 (!) 100/50   Most recent eGFR/CrCl:  Lab Results  Component Value Date   EGFR 78 03/18/2021    No components found for: CRCL Current Barriers:  Patient with a history of hypertension. 04/16/2021  Patient reports Hypertension is well managed. Reports that he takes his medications as prescribed. Recent Blood pressure are good. Does not self monitor. Case Manager Clinical Goal(s):  patient will verbalize understanding of plan for hypertension management patient will attend all scheduled medical appointments: PCP, labs and this case manager Interventions:  Collaboration with Lillard Anes, MD regarding development and update of comprehensive plan of care as evidenced by provider attestation and co-signature Inter-disciplinary care team collaboration (see longitudinal plan of care) Evaluation of current treatment plan related to hypertension self management and patient's adherence to plan as established by provider. Provided education to patient re: stroke prevention, s/s of heart attack and stroke, DASH diet, complications of uncontrolled blood pressure Reviewed medications with patient and discussed importance of compliance Discussed  plans with patient for ongoing care management follow up and provided patient with direct contact information for care management team Reviewed scheduled/upcoming provider appointments including: PCP, labs, and this Case manager Provided my contact information. Reviewed importance of low salt diet and exercise.  Self-Care Activities/Patient Goals: - Self administers medications as  prescribed -Attends all scheduled provider appointments -Calls provider office for new concerns, questions, or BP outside discussed parameters -Follows a low sodium diet/DASH diet Follow Up Plan: Telephone follow up appointment with care management team member scheduled for: 07/18/2021     Plan:Telephone follow up appointment with care management team member scheduled for:  07/16/2021  Tomasa Rand RN, BSN, CEN RN Case Manager - [facility] [employer] exhibitions [employer] Mobile: [phone]

## 2021-04-16 NOTE — Patient Instructions (Signed)
Visit Information   Thank you for taking time to visit with me today. Please don't hesitate to contact me if I can be of assistance to you before our next scheduled telephone appointment.  Following are the goals we discussed today:  (Copy and paste patient goals from clinical care plan here) Our next appointment is by telephone on 07/18/2021 at 1045  Please call the care guide team at [phone] if you need to cancel or reschedule your appointment.   If you are experiencing a Mental Health or [place] or need someone to talk to, please call the Suicide and Crisis Lifeline: 988 call the [place] National Suicide Prevention Lifeline: 938-277-5500 or TTY: (838)021-5303 TTY (367)352-0623) to talk to a trained counselor call 1-800-273-TALK (toll free, 24 hour hotline) call 911   Following is a copy of your full care plan:  Care Plan : Diabetes Type 2 (Adult)  Updates made by Thana Ates, RN since 04/16/2021 12:00 AM     Problem: Disease Progression (Diabetes, Type 2)   Priority: Medium  Onset Date: 04/16/2021  Note:   Objective:  Lab Results  Component Value Date   HGBA1C 6.6 (H) 03/18/2021   Lab Results  Component Value Date   CREATININE 1.02 03/18/2021   CREATININE 1.17 10/23/2020   CREATININE 1.12 06/26/2020   Lab Results  Component Value Date   EGFR 78 03/18/2021   Current Barriers:  Patient with history of DM since 1994.  04/16/2021  Patient reports self monitoring daily.  Range 110-120. Recent A1c of 6.6.  Follows DM diet and takes medications as prescribed.   Case Manager Clinical Goal(s):  patient will demonstrate improved adherence to prescribed treatment plan for diabetes self care/management as evidenced by: daily monitoring and recording of CBG      contacting provider for new or worsened symptoms or questions      Interventions:  Collaboration with Lillard Anes, MD regarding development and update of comprehensive plan of care as  evidenced by provider attestation and co-signature Inter-disciplinary care team collaboration (see longitudinal plan of car Provided education to patient about basic DM disease process Reviewed medications with patient and discussed importance of medication adherence Discussed plans with patient for ongoing care management follow up and provided patient with direct contact information for care management team Reviewed scheduled/upcoming provider appointments including: PCP, labs and this case manager Advised patient, providing education and rationale, to check cbg daily and record, calling MD  for findings outside established parameters.   Will mail AVS to patient with DM educations and low salt diet information Self-Care Activities/Patient Goals: - Self administers oral medications as prescribed Attends all scheduled provider appointments - check blood sugar at prescribed times - take the blood sugar meter to all doctor visits Follow Up Plan: Telephone follow up appointment with care management team member scheduled for:   07/18/2021    Care Plan : Hypertension (Adult)  Updates made by Thana Ates, RN since 04/16/2021 12:00 AM     Problem: Disease Progression (Hypertension)      Problem: Resistant Hypertension (Hypertension)   Priority: Medium  Onset Date: 04/16/2021  Note:   Objective:  Last practice recorded BP readings:  BP Readings from Last 3 Encounters:  03/18/21 120/60  01/08/21 138/72  10/23/20 (!) 100/50   Most recent eGFR/CrCl:  Lab Results  Component Value Date   EGFR 78 03/18/2021    No components found for: CRCL Current Barriers:  Patient with a history of hypertension. 04/16/2021  Patient reports Hypertension is well managed. Reports that he takes his medications as prescribed. Recent Blood pressure are good. Does not self monitor. Case Manager Clinical Goal(s):  patient will verbalize understanding of plan for hypertension management patient will attend all  scheduled medical appointments: PCP, labs and this case manager Interventions:  Collaboration with Lillard Anes, MD regarding development and update of comprehensive plan of care as evidenced by provider attestation and co-signature Inter-disciplinary care team collaboration (see longitudinal plan of care) Evaluation of current treatment plan related to hypertension self management and patient's adherence to plan as established by provider. Provided education to patient re: stroke prevention, s/s of heart attack and stroke, DASH diet, complications of uncontrolled blood pressure Reviewed medications with patient and discussed importance of compliance Discussed plans with patient for ongoing care management follow up and provided patient with direct contact information for care management team Reviewed scheduled/upcoming provider appointments including: PCP, labs, and this Case manager Provided my contact information. Reviewed importance of low salt diet and exercise.  Self-Care Activities/Patient Goals: - Self administers medications as prescribed -Attends all scheduled provider appointments -Calls provider office for new concerns, questions, or BP outside discussed parameters -Follows a low sodium diet/DASH diet Follow Up Plan: Telephone follow up appointment with care management team member scheduled for: 07/18/2021     Consent to CCM Services: Mr. Thornsberry was given information about Chronic Care Management services including:  CCM service includes personalized support from designated clinical staff supervised by his physician, including individualized plan of care and coordination with other care providers 24/7 contact phone numbers for assistance for urgent and routine care needs. Service will only be billed when office clinical staff spend 20 minutes or more in a month to coordinate care. Only one practitioner may furnish and bill the service in a calendar month. The patient may  stop CCM services at any time (effective at the end of the month) by phone call to the office staff. The patient will be responsible for cost sharing (co-pay) of up to 20% of the service fee (after annual deductible is met).  Patient agreed to services and verbal consent obtained.   The patient verbalized understanding of instructions, educational materials, and care plan provided today and agreed to receive a mailed copy of patient instructions, educational materials, and care plan.   Telephone follow up appointment with care management team member scheduled for:    Visit Information  Thank you for taking time to visit with me today. Please don't hesitate to contact me if I can be of assistance to you before our next scheduled telephone appointment.  Following are the goals we discussed today:  (Copy and paste patient goals from clinical care plan here)  Our next appointment is by telephone on 07/18/2021 at 1045  Please call the care guide team at [phone] if you need to cancel or reschedule your appointment.   If you are experiencing a Mental Health or [place] or need someone to talk to, please call the Suicide and Crisis Lifeline: 988 call the [place] National Suicide Prevention Lifeline: (856) 294-7166 or TTY: 671-865-4763 TTY (671)737-3985) to talk to a trained counselor call 1-800-273-TALK (toll free, 24 hour hotline) call 911   The patient verbalized understanding of instructions, educational materials, and care plan provided today and agreed to receive a mailed copy of patient instructions, educational materials, and care plan.  Telephone follow up appointment with care management team member scheduled for: 07/18/2021  Tomasa Rand RN, BSN, CEN RN  Case Manager - [facility] Mobile: [phone]

## 2021-04-29 ENCOUNTER — Other Ambulatory Visit: Payer: Self-pay | Admitting: Legal Medicine

## 2021-04-29 DIAGNOSIS — N138 Other obstructive and reflux uropathy: Secondary | ICD-10-CM

## 2021-06-03 ENCOUNTER — Other Ambulatory Visit: Payer: Self-pay | Admitting: Legal Medicine

## 2021-06-03 DIAGNOSIS — I2510 Atherosclerotic heart disease of native coronary artery without angina pectoris: Secondary | ICD-10-CM

## 2021-06-03 DIAGNOSIS — E1159 Type 2 diabetes mellitus with other circulatory complications: Secondary | ICD-10-CM

## 2021-06-03 DIAGNOSIS — N138 Other obstructive and reflux uropathy: Secondary | ICD-10-CM

## 2021-06-03 DIAGNOSIS — I10 Essential (primary) hypertension: Secondary | ICD-10-CM

## 2021-06-03 DIAGNOSIS — N401 Enlarged prostate with lower urinary tract symptoms: Secondary | ICD-10-CM

## 2021-06-20 ENCOUNTER — Other Ambulatory Visit: Payer: Self-pay

## 2021-06-20 DIAGNOSIS — N138 Other obstructive and reflux uropathy: Secondary | ICD-10-CM

## 2021-06-20 MED ORDER — FINASTERIDE 5 MG PO TABS: 5.0000 mg | ORAL_TABLET | Freq: Every day | ORAL | 3 refills | Status: DC

## 2021-06-20 NOTE — Telephone Encounter (Signed)
[facility] no longer stocks this. Requests this to go to local pharmacy.   Give call back when sent.   Royce Macadamia, [place] 06/20/21 10:38 AM

## 2021-07-08 ENCOUNTER — Other Ambulatory Visit: Payer: Self-pay

## 2021-07-08 DIAGNOSIS — I10 Essential (primary) hypertension: Secondary | ICD-10-CM

## 2021-07-08 DIAGNOSIS — I2510 Atherosclerotic heart disease of native coronary artery without angina pectoris: Secondary | ICD-10-CM

## 2021-07-08 DIAGNOSIS — E119 Type 2 diabetes mellitus without complications: Secondary | ICD-10-CM | POA: Diagnosis not present

## 2021-07-08 MED ORDER — LISINOPRIL-HYDROCHLOROTHIAZIDE 20-12.5 MG PO TABS: 1.0000 | ORAL_TABLET | Freq: Every day | ORAL | 2 refills | Status: DC

## 2021-07-15 NOTE — Progress Notes (Signed)
Subjective:  Patient ID: Lance Riddle, male    DOB: 11-May-1946  Age: 76 y.o. MRN: [id number]  Chief Complaint  Patient presents with   Diabetes   Hyperlipidemia   Hypertension    HPI: chronic visit   Diabetes: Patient is taking  glimepiride 4 mg daily, Metformin 500 mg twice a day. Last A1C 6.6% , Last eye exam 02/2021. Blood sugar range 120-140 range. Patient checks his feet daily.  Hypertension:  He takes Lisinopril-HCTZ 20-12.5 mg daily, Amlodipine 10 mg daily. Patient presents for follow up of hypertension.  Patient tolerating lisinopril/HCTZ, amlodipine well with side effects.  Patient was diagnosed with hypertension 2010 so has been treated for hypertension for 12 years.Patient is working on maintaining diet and exercise regimen and follows up as directed. Complication include CAD .   Hyperlipidemia: Currently taking Simvastatin 40 mg daily.  Current Outpatient Medications on File Prior to Visit  Medication Sig Dispense Refill   amLODipine (NORVASC) 10 MG tablet TAKE 1 TABLET BY MOUTH ONCE DAILY 90 tablet 3   aspirin 81 MG tablet Take 81 mg by mouth daily.     finasteride (PROSCAR) 5 MG tablet Take 1 tablet (5 mg total) by mouth daily. 90 tablet 3   glimepiride (AMARYL) 4 MG tablet TAKE 1 TABLET BY MOUTH  DAILY 90 tablet 2   linaclotide (LINZESS) 72 MCG capsule Take 1 capsule (72 mcg total) by mouth daily before breakfast. 30 capsule 6   lisinopril-hydrochlorothiazide (ZESTORETIC) 20-12.5 MG tablet Take 1 tablet by mouth daily. 90 tablet 2   metFORMIN (GLUCOPHAGE) 500 MG tablet TAKE 1 TABLET BY MOUTH  TWICE DAILY 180 tablet 2   nitroGLYCERIN (NITROSTAT) 0.4 MG SL tablet Place 1 tablet (0.4 mg total) under the tongue every 5 (five) minutes as needed for chest pain. 25 tablet 3   omeprazole (PRILOSEC) 20 MG capsule TAKE 1 CAPSULE BY MOUTH  DAILY 90 capsule 3   simvastatin (ZOCOR) 40 MG tablet TAKE 1 TABLET BY MOUTH ONCE DAILY 90 tablet 3   tamsulosin (FLOMAX) 0.4 MG CAPS  capsule TAKE 1 CAPSULE BY MOUTH  ONCE DAILY 90 capsule 2   No current facility-administered medications on file prior to visit.   Past Medical History:  Diagnosis Date   Adjustment disorder with depressed mood 06/16/2019   BMI 31.0-31.9,adult 10/14/2019   Combined hyperlipidemia associated with type 2 diabetes mellitus ([place]) 06/16/2019   Coronary artery disease involving native coronary artery of native heart without angina pectoris 12/19/2009   Qualifier: Diagnosis of  By: Orville Govern, CMA, Carol     Coronary atherosclerosis of native coronary artery    2007 catheterization D1 70%, ramus intermediate 40%, large RV branch off the right coronary artery 60-70%.; Lexiscan Myoview (03/2013):  No ischemia, EF 58% (normal study).   Enlarged prostate with urinary obstruction 06/16/2019   Essential hypertension 12/30/2008   Qualifier: Diagnosis of  By: Orville Govern, CMA, Carol     Hyperlipidemia    Mixed hyperlipidemia 12/30/2008   Qualifier: Diagnosis of  By: Orville Govern, CMA, Carol     Reflux esophagitis 06/16/2019   Routine general medical examination at a health care facility 03/22/2020   Past Surgical History:  Procedure Laterality Date   ANGIOPLASTY  1994   CARDIAC SURGERY     TONSILLECTOMY AND ADENOIDECTOMY      Family History  Problem Relation Age of Onset   Heart attack Mother    Coronary artery disease Mother    Lung cancer Father    Diabetes Father  Hypertension Father    Cancer Brother    Coronary artery disease Other        paternal side of family   Diabetes Brother    Drug abuse Brother    Social History   Socioeconomic History   Marital status: Married    Spouse name: Not on file   Number of children: 0   Years of education: Not on file   Highest education level: Not on file  Occupational History   Occupation: farmer  Tobacco Use   Smoking status: Former    Types: Cigarettes    Quit date: 1994    Years since quitting: 29.1   Smokeless tobacco: Current    Types: Chew   Tobacco  comments:    1 can a week  Substance and Sexual Activity   Alcohol use: Not Currently   Drug use: Not Currently   Sexual activity: Not Currently  Other Topics Concern   Not on file  Social History Narrative   Not on file   Social Determinants of Health   Financial Resource Strain: Not on file  Food Insecurity: Not on file  Transportation Needs: Not on file  Physical Activity: Not on file  Stress: Not on file  Social Connections: Not on file    Review of Systems  Constitutional:  Negative for chills, fatigue, fever and unexpected weight change.  HENT:  Positive for congestion and sore throat. Negative for ear pain and sinus pain.   Respiratory:  Positive for cough. Negative for shortness of breath.   Cardiovascular:  Negative for chest pain and palpitations.  Gastrointestinal:  Negative for abdominal pain, blood in stool, constipation, diarrhea, nausea and vomiting.  Endocrine: Negative for polydipsia.  Genitourinary:  Negative for dysuria.  Musculoskeletal:  Negative for back pain.  Skin:  Negative for rash.  Neurological:  Positive for headaches.    Objective:  BP 100/60    Pulse 76    Temp 98.7 F (37.1 C)    Resp 15    Ht '5\' 3"'$  (1.6 m)    Wt 186 lb (84.4 kg)    SpO2 97%    BMI 32.95 kg/m   BP/Weight 07/16/2021 04/16/2021 47/0/9628  Systolic BP 366 - 294  Diastolic BP 60 - 60  Wt. (Lbs) 186 179 179  BMI 32.95 31.71 31.71    Physical Exam Vitals reviewed.  Constitutional:      General: He is not in acute distress.    Appearance: Normal appearance.  HENT:     Right Ear: Tympanic membrane normal.     Left Ear: Tympanic membrane normal.     Nose: Nose normal.     Mouth/Throat:     Mouth: Mucous membranes are moist.     Pharynx: Oropharynx is clear.  Eyes:     Extraocular Movements: Extraocular movements intact.     Pupils: Pupils are equal, round, and reactive to light.  Cardiovascular:     Rate and Rhythm: Normal rate and regular rhythm.     Pulses: Normal  pulses.     Heart sounds: Normal heart sounds. No murmur heard.   No gallop.  Pulmonary:     Effort: Pulmonary effort is normal. No respiratory distress.     Breath sounds: Normal breath sounds. No wheezing.  Abdominal:     General: Abdomen is flat. Bowel sounds are normal. There is no distension.     Tenderness: There is no abdominal tenderness.  Musculoskeletal:     Cervical back: Normal  range of motion and neck supple.     Right lower leg: No edema.     Left lower leg: No edema.  Skin:    General: Skin is warm.     Capillary Refill: Capillary refill takes less than 2 seconds.  Neurological:     General: No focal deficit present.     Mental Status: He is alert and oriented to person, place, and time. Mental status is at baseline.     Deep Tendon Reflexes: Reflexes normal.  Psychiatric:        Mood and Affect: Mood normal.        Thought Content: Thought content normal.    Diabetic Foot Exam - Simple   Simple Foot Form Diabetic Foot exam was performed with the following findings: Yes 07/16/2021  9:38 AM  Visual Inspection No deformities, no ulcerations, no other skin breakdown bilaterally: Yes Sensation Testing Intact to touch and monofilament testing bilaterally: Yes Pulse Check Posterior Tibialis and Dorsalis pulse intact bilaterally: Yes Comments      Lab Results  Component Value Date   WBC 9.3 03/18/2021   HGB 13.4 03/18/2021   HCT 41.6 03/18/2021   PLT 252 03/18/2021   GLUCOSE 122 (H) 03/18/2021   CHOL 124 03/18/2021   TRIG 66 03/18/2021   HDL 52 03/18/2021   LDLCALC 58 03/18/2021   ALT 21 03/18/2021   AST 16 03/18/2021   NA 131 (L) 03/18/2021   K 5.3 (H) 03/18/2021   CL 95 (L) 03/18/2021   CREATININE 1.02 03/18/2021   BUN 14 03/18/2021   CO2 24 03/18/2021   TSH 1.030 02/17/2020   HGBA1C 6.6 (H) 03/18/2021   MICROALBUR 10 10/23/2020      Assessment & Plan:   Problem List Items Addressed This Visit       Cardiovascular and Mediastinum    Essential hypertension - Primary   Relevant Orders   Comprehensive metabolic panel   CBC with Differential/Platelet An individual hypertension care plan was established and reinforced today.  The patient's status was assessed using clinical findings on exam and labs or diagnostic tests. The patient's success at meeting treatment goals on disease specific evidence-based guidelines and found to be well controlled. SELF MANAGEMENT: The patient and I together assessed ways to personally work towards obtaining the recommended goals. RECOMMENDATIONS: avoid decongestants found in common cold remedies, decrease consumption of alcohol, perform routine monitoring of BP with home BP cuff, exercise, reduction of dietary salt, take medicines as prescribed, try not to miss doses and quit smoking.  Regular exercise and maintaining a healthy weight is needed.  Stress reduction may help. A CLINICAL SUMMARY including written plan identify barriers to care unique to individual due to social or financial issues.  We attempt to mutually creat solutions for individual and family understanding.    Coronary artery disease involving native coronary artery of native heart without angina pectoris An individual plan was formulated based on patient history and exam, labs and evidence based data. Patient has not had recent angina or nitroglycerin use. continue present treatment.      Digestive   Reflux esophagitis (Chronic) Plan of care was formulated today.  he is doing well.  A plan of care was formulated using patient exam, tests and other sources to optimize care using evidence based information.  Recommend no smoking, no eating after supper, avoid fatty foods, elevate Head of bed, avoid tight fitting clothing.  Continue on omeprazole.      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  The patient's status was assessed using clinical findings on exam, labs and diagnostic testing. Patient success at meeting goals based on disease specific evidence-based guidelines and found to be fair controlled. Medications were assessed and patient's understanding of the medical issues , including barriers were assessed. Recommend adherence to a diabetic diet, a graduated exercise program, HgbA1c level is checked quarterly, and urine microalbumin performed yearly .  Annual mono-filament sensation testing performed. Lower blood pressure and control hyperlipidemia is important. Get annual eye exams and annual flu shots and smoking cessation discussed.  Self management goals were discussed.      Genitourinary   Enlarged prostate with urinary obstruction (Chronic)   Relevant Orders   PSA AN INDIVIDUAL CARE PLAN for BPH was established and reinforced today.  The patient's status was assessed using clinical findings on exam, labs, and other diagnostic testing. Patient's success at meeting treatment goals based on disease specific evidence-bassed guidelines and found to be in good control. RECOMMENDATIONS include continue present medicines and treatment.      Other   Mixed hyperlipidemia   Relevant Orders   Lipid panel   TSH AN INDIVIDUAL CARE PLAN for hyperlipidemia/ cholesterol was established and reinforced today.  The patient's status was assessed using clinical findings on exam, lab and other diagnostic tests. The patient's disease status was assessed based on evidence-based guidelines and found to be fair controlled. MEDICATIONS were reviewed. SELF MANAGEMENT GOALS have been discussed and patient's success at attaining the goal of low cholesterol was assessed. RECOMMENDATION given include regular exercise 3 days a week and low cholesterol/low fat diet. CLINICAL SUMMARY including written plan to identify barriers unique to the patient  due to social or economic  reasons was discussed.     Adjustment disorder with depressed mood Patient has improved  mood    BMI 32.0-32.9,adult An individualize plan was formulated for obesity using patient history and physical exam to encourage weight loss.  An evidence based program was formulated.  Patient is to cut portion size with meals and to plan physical exercise 3 days a week at least 20 minutes.  Weight watchers and other programs are helpful.  Planned amount of weight loss 10 lbs.   . 30 minute visit with review of old records   Orders Placed This Encounter  Procedures   Comprehensive metabolic panel   Hemoglobin A1c   Lipid panel   PSA   TSH   Microalbumin / creatinine urine ratio   CBC with Differential/Platelet     Follow-up: Return in about 4 months (around 11/15/2021) for fasting.  An After Visit Summary was printed and given to the patient.  Reinaldo Meeker, MD [facility] [phone]

## 2021-07-16 ENCOUNTER — Other Ambulatory Visit: Payer: Self-pay

## 2021-07-16 ENCOUNTER — Ambulatory Visit (INDEPENDENT_AMBULATORY_CARE_PROVIDER_SITE_OTHER): Payer: Medicare Other | Admitting: Legal Medicine

## 2021-07-16 ENCOUNTER — Encounter: Payer: Self-pay | Admitting: Legal Medicine

## 2021-07-16 VITALS — BP 100/60 | HR 76 | Temp 98.7°F | Resp 15 | Ht 63.0 in | Wt 186.0 lb

## 2021-07-16 DIAGNOSIS — Z6832 Body mass index (BMI) 32.0-32.9, adult: Secondary | ICD-10-CM

## 2021-07-16 DIAGNOSIS — E782 Mixed hyperlipidemia: Secondary | ICD-10-CM | POA: Diagnosis not present

## 2021-07-16 DIAGNOSIS — E1169 Type 2 diabetes mellitus with other specified complication: Secondary | ICD-10-CM

## 2021-07-16 DIAGNOSIS — K2100 Gastro-esophageal reflux disease with esophagitis, without bleeding: Secondary | ICD-10-CM | POA: Diagnosis not present

## 2021-07-16 DIAGNOSIS — I10 Essential (primary) hypertension: Secondary | ICD-10-CM | POA: Diagnosis not present

## 2021-07-16 DIAGNOSIS — N138 Other obstructive and reflux uropathy: Secondary | ICD-10-CM

## 2021-07-16 DIAGNOSIS — I2510 Atherosclerotic heart disease of native coronary artery without angina pectoris: Secondary | ICD-10-CM | POA: Diagnosis not present

## 2021-07-16 DIAGNOSIS — F4321 Adjustment disorder with depressed mood: Secondary | ICD-10-CM

## 2021-07-16 DIAGNOSIS — N401 Enlarged prostate with lower urinary tract symptoms: Secondary | ICD-10-CM

## 2021-07-17 LAB — CBC WITH DIFFERENTIAL/PLATELET
Basophils Absolute: 0 10*3/uL (ref 0.0–0.2)
Basos: 0 %
EOS (ABSOLUTE): 0.4 10*3/uL (ref 0.0–0.4)
Eos: 4 %
Hematocrit: 41.7 % (ref 37.5–51.0)
Hemoglobin: 13.7 g/dL (ref 13.0–17.7)
Immature Grans (Abs): 0 10*3/uL (ref 0.0–0.1)
Immature Granulocytes: 0 %
Lymphocytes Absolute: 2 10*3/uL (ref 0.7–3.1)
Lymphs: 20 %
MCH: 29 pg (ref 26.6–33.0)
MCHC: 32.9 g/dL (ref 31.5–35.7)
MCV: 88 fL (ref 79–97)
Monocytes Absolute: 0.9 10*3/uL (ref 0.1–0.9)
Monocytes: 9 %
Neutrophils Absolute: 6.8 10*3/uL (ref 1.4–7.0)
Neutrophils: 67 %
Platelets: 243 10*3/uL (ref 150–450)
RBC: 4.72 x10E6/uL (ref 4.14–5.80)
RDW: 13.3 % (ref 11.6–15.4)
WBC: 10.2 10*3/uL (ref 3.4–10.8)

## 2021-07-17 LAB — LIPID PANEL
Chol/HDL Ratio: 2.5 ratio (ref 0.0–5.0)
Cholesterol, Total: 141 mg/dL (ref 100–199)
HDL: 56 mg/dL (ref >39)
LDL Chol Calc (NIH): 66 mg/dL (ref 0–99)
Triglycerides: 101 mg/dL (ref 0–149)
VLDL Cholesterol Cal: 19 mg/dL (ref 5–40)

## 2021-07-17 LAB — COMPREHENSIVE METABOLIC PANEL
ALT: 19 IU/L (ref 0–44)
AST: 13 IU/L (ref 0–40)
Albumin/Globulin Ratio: 1.8 (ref 1.2–2.2)
Albumin: 4.6 g/dL (ref 3.7–4.7)
Alkaline Phosphatase: 55 IU/L (ref 44–121)
BUN/Creatinine Ratio: 13 (ref 10–24)
BUN: 14 mg/dL (ref 8–27)
Bilirubin Total: 0.4 mg/dL (ref 0.0–1.2)
CO2: 22 mmol/L (ref 20–29)
Calcium: 9.5 mg/dL (ref 8.6–10.2)
Chloride: 89 mmol/L — ABNORMAL LOW (ref 96–106)
Creatinine, Ser: 1.05 mg/dL (ref 0.76–1.27)
Globulin, Total: 2.5 g/dL (ref 1.5–4.5)
Glucose: 136 mg/dL — ABNORMAL HIGH (ref 70–99)
Potassium: 5.1 mmol/L (ref 3.5–5.2)
Sodium: 126 mmol/L — ABNORMAL LOW (ref 134–144)
Total Protein: 7.1 g/dL (ref 6.0–8.5)
eGFR: 74 mL/min/{1.73_m2} (ref >59)

## 2021-07-17 LAB — CARDIOVASCULAR RISK ASSESSMENT

## 2021-07-17 LAB — HEMOGLOBIN A1C
Est. average glucose Bld gHb Est-mCnc: 174 mg/dL
Hgb A1c MFr Bld: 7.7 % — ABNORMAL HIGH (ref 4.8–5.6)

## 2021-07-17 LAB — PSA: Prostate Specific Ag, Serum: 1 ng/mL (ref 0.0–4.0)

## 2021-07-17 LAB — MICROALBUMIN / CREATININE URINE RATIO
Creatinine, Urine: 35.4 mg/dL
Microalb/Creat Ratio: 45 mg/g creat — ABNORMAL HIGH (ref 0–29)
Microalbumin, Urine: 15.8 ug/mL

## 2021-07-17 LAB — TSH: TSH: 1.18 u[IU]/mL (ref 0.450–4.500)

## 2021-07-18 ENCOUNTER — Ambulatory Visit (INDEPENDENT_AMBULATORY_CARE_PROVIDER_SITE_OTHER): Payer: Medicare Other

## 2021-07-18 DIAGNOSIS — I10 Essential (primary) hypertension: Secondary | ICD-10-CM

## 2021-07-18 DIAGNOSIS — E1169 Type 2 diabetes mellitus with other specified complication: Secondary | ICD-10-CM

## 2021-07-18 NOTE — Patient Instructions (Signed)
Visit Information ? ?Thank you for taking time to visit with me today. Please don't hesitate to contact me if I can be of assistance to you before our next scheduled telephone appointment. ? ?Following are the goals we discussed today:  ?Take medications as prescribed   ?Attend all scheduled provider appointments ?Call pharmacy for medication refills 3-7 days in advance of running out of medications ?Call provider office for new concerns or questions  ?check feet daily for cuts, sores or redness ?take the blood sugar log to all doctor visits ?trim toenails straight across ?drink 6 to 8 glasses of water each day ?wash and dry feet carefully every day ?wear comfortable, cotton socks ?wear comfortable, well-fitting shoes ?write blood pressure results in a log or diary ?keep a blood pressure log ?take blood pressure log to all doctor appointments ?call doctor for signs and symptoms of high blood pressure ?take medications for blood pressure exactly as prescribed ? ?Our next appointment is by telephone on 10/17/2021 at 0900 ? ?Please call the care guide team at [phone] if you need to cancel or reschedule your appointment.  ? ?If you are experiencing a Mental Health or [place] or need someone to talk to, please call the Suicide and Crisis Lifeline: 988 ?call the [place] National Suicide Prevention Lifeline: (931)107-8210 or TTY: 254 475 0605 TTY 224-545-0618) to talk to a trained counselor ?call 1-800-273-TALK (toll free, 24 hour hotline) ?call 911  ? ?The patient verbalized understanding of instructions, educational materials, and care plan provided today and agreed to receive a mailed copy of patient instructions, educational materials, and care plan.  ? ?Tomasa Rand RN, BSN, CEN ?RN Case Manager - [facility] ?[place] ?Mobile: [phone]  ?

## 2021-07-18 NOTE — Chronic Care Management (AMB) (Signed)
?Chronic Care Management  ? ?CCM RN Visit Note ? ?07/18/2021 ?Name: Lance Riddle MRN: [id number] DOB: 05/17/1945 ? ?Subjective: ?Lance Riddle is a 76 y.o. year old male who is a primary care patient of Lillard Anes, MD. The care management team was consulted for assistance with disease management and care coordination needs.   ? ?Engaged with patient by telephone for follow up visit in response to provider referral for case management and/or care coordination services.  ? ?Consent to Services:  ?The patient was given information about Chronic Care Management services, agreed to services, and gave verbal consent prior to initiation of services.  Please see initial visit note for detailed documentation.  ? ?Patient agreed to services and verbal consent obtained.  ? ?Assessment: Review of patient past medical history, allergies, medications, health status, including review of consultants reports, laboratory and other test data, was performed as part of comprehensive evaluation and provision of chronic care management services.  ? ?SDOH (Social Determinants of Health) assessments and interventions performed:   ? ?CCM Care Plan ? ?No Known Allergies ? ?Outpatient Encounter Medications as of 07/18/2021  ?Medication Sig  ? amLODipine (NORVASC) 10 MG tablet TAKE 1 TABLET BY MOUTH ONCE DAILY  ? aspirin 81 MG tablet Take 81 mg by mouth daily.  ? finasteride (PROSCAR) 5 MG tablet Take 1 tablet (5 mg total) by mouth daily.  ? glimepiride (AMARYL) 4 MG tablet TAKE 1 TABLET BY MOUTH  DAILY  ? linaclotide (LINZESS) 72 MCG capsule Take 1 capsule (72 mcg total) by mouth daily before breakfast.  ? lisinopril-hydrochlorothiazide (ZESTORETIC) 20-12.5 MG tablet Take 1 tablet by mouth daily.  ? metFORMIN (GLUCOPHAGE) 500 MG tablet TAKE 1 TABLET BY MOUTH  TWICE DAILY  ? nitroGLYCERIN (NITROSTAT) 0.4 MG SL tablet Place 1 tablet (0.4 mg total) under the tongue every 5 (five) minutes as needed for chest pain.  ? omeprazole  (PRILOSEC) 20 MG capsule TAKE 1 CAPSULE BY MOUTH  DAILY  ? simvastatin (ZOCOR) 40 MG tablet TAKE 1 TABLET BY MOUTH ONCE DAILY  ? tamsulosin (FLOMAX) 0.4 MG CAPS capsule TAKE 1 CAPSULE BY MOUTH  ONCE DAILY  ? ?No facility-administered encounter medications on file as of 07/18/2021.  ? ? ?Patient Active Problem List  ? Diagnosis Date Noted  ? BMI 32.0-32.9,adult 10/14/2019  ? Combined hyperlipidemia associated with type 2 diabetes mellitus ([place]) 06/16/2019  ? Enlarged prostate with urinary obstruction 06/16/2019  ? Reflux esophagitis 06/16/2019  ? Adjustment disorder with depressed mood 06/16/2019  ? 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 Mixed hyperlipidemia 12/30/2008  ? Essential hypertension 12/30/2008  ? ? ?Conditions to be addressed/monitored:HTN and DMII ? ? ?Long-Range Goal: Development of plan of care for the management of chronic disease states ( DM, HTN)   ?Start Date: 07/18/2021  ?Expected End Date: 07/19/2022  ?Priority: High  ?Note:   ?Current Barriers:  ?Chronic Disease Management support and education needs related to HTN and DMII ?07/18/2021  DM- reports fasting CBG of 120-130.  Today's CBG 112. Reports that he is taking all his medications as prescribed. Denies any lows. HTN- reports BP has been good. Continues to take his medications as prescribed.  ? ?RNCM Clinical Goal(s):  ?Patient will demonstrate ongoing adherence to prescribed treatment plan for HTN and DMII as evidenced by patients verbal report and review of medical recordand  through collaboration with RN Care manager, provider, and care team.  ? ?Interventions: ?1:1 collaboration  with primary care provider regarding development and update of comprehensive plan of care as evidenced by provider attestation and co-signature ?Inter-disciplinary care team collaboration (see longitudinal plan of care) ?Evaluation of current treatment plan related to  self management and patient's  adherence to plan as established by provider ? ? ?Diabetes:  (Status: New goal.) Long Term Goal  ? ?Lab Results  ?Component Value Date  ? HGBA1C 7.7 (H) 07/16/2021  ?Assessed patient's understanding of A1c goal: <6.5% ?Reviewed medications with patient and discussed importance of medication adherence;        ?Reviewed prescribed diet with patient diabetic diet; ?Counseled on importance of regular laboratory monitoring as prescribed;        ?Discussed plans with patient for ongoing care management follow up and provided patient with direct contact information for care management team;      ?Provided patient with written educational materials related to hypo and hyperglycemia and importance of correct treatment;       ?Reviewed scheduled/upcoming provider appointments including: PCP and this Care Manager;         ? ?Hypertension: (Status: New goal.) Long Term Goal  ?Last practice recorded BP readings:  ?BP Readings from Last 3 Encounters:  ?07/16/21 100/60  ?03/18/21 120/60  ?01/08/21 138/72  ?Most recent eGFR/CrCl:  ?Lab Results  ?Component Value Date  ? EGFR 74 07/16/2021  ?  No components found for: CRCL ? ?Reviewed medications with patient and discussed importance of compliance;  ?Discussed plans with patient for ongoing care management follow up and provided patient with direct contact information for care management team; ?Advised patient, providing education and rationale, to monitor blood pressure daily and record, calling PCP for findings outside established parameters;  ?Reviewed scheduled/upcoming provider appointments including:  ? ?Patient Goals/Self-Care Activities: ?Take medications as prescribed   ?Attend all scheduled provider appointments ?Call pharmacy for medication refills 3-7 days in advance of running out of medications ?Call provider office for new concerns or questions  ?check feet daily for cuts, sores or redness ?take the blood sugar log to all doctor visits ?trim toenails straight  across ?drink 6 to 8 glasses of water each day ?wash and dry feet carefully every day ?wear comfortable, cotton socks ?wear comfortable, well-fitting shoes ?write blood pressure results in a log or diary ?keep a blood pressure log ?take blood pressure log to all doctor appointments ?call doctor for signs and symptoms of high blood pressure ?take medications for blood pressure exactly as prescribed ?  ?  ?  ? ? ?Plan:Telephone follow up appointment with care management team member scheduled for:  10/17/2021  at 0900 ? ?Tomasa Rand RN, BSN, CEN ?RN Case Manager - [facility] ?[place] ?Mobile: [phone]  ? ? ? ? ? ? ? ? ?

## 2021-07-18 NOTE — Progress Notes (Signed)
Glucose 136, kidney tests normal, sodium low 126, Liver tests normal, A1c 7.7 OK, Urinary microalbuminuria is high, I recommend a 24 urine collection for protein, PSA 1.0 normal, TSH 1.18 normal, CBC normal ?lp

## 2021-07-19 ENCOUNTER — Other Ambulatory Visit: Payer: Self-pay | Admitting: Legal Medicine

## 2021-07-19 DIAGNOSIS — R801 Persistent proteinuria, unspecified: Secondary | ICD-10-CM

## 2021-07-22 ENCOUNTER — Other Ambulatory Visit: Payer: Medicare Other

## 2021-07-22 ENCOUNTER — Other Ambulatory Visit: Payer: Self-pay

## 2021-07-22 DIAGNOSIS — R801 Persistent proteinuria, unspecified: Secondary | ICD-10-CM | POA: Diagnosis not present

## 2021-07-23 LAB — PROTEIN, URINE, 24 HOUR
Protein, 24H Urine: 460 mg/24 hr — ABNORMAL HIGH (ref 30–150)
Protein, Ur: 21.9 mg/dL

## 2021-07-23 NOTE — Progress Notes (Signed)
Urinary protein is 460 which is high but not pathological, we will watch ?lp

## 2021-07-25 ENCOUNTER — Ambulatory Visit (INDEPENDENT_AMBULATORY_CARE_PROVIDER_SITE_OTHER): Payer: Medicare Other

## 2021-07-25 VITALS — BP 138/70 | HR 71 | Resp 16 | Ht 63.0 in | Wt 188.8 lb

## 2021-07-25 DIAGNOSIS — Z0000 Encounter for general adult medical examination without abnormal findings: Secondary | ICD-10-CM | POA: Diagnosis not present

## 2021-07-25 NOTE — Progress Notes (Signed)
? ?Subjective:  ? Lance Riddle is a 76 y.o. male who presents for Medicare Annual/Subsequent preventive examination.  This wellness visit is conducted by a nurse.  The patient's medications were reviewed and reconciled since the patient's last visit.  History details were provided by the patient.  The history appears to be reliable.   ? ?Patient's last AWV was one year ago.   ?Medical History: Patient history and Family history was reviewed  ?Medications, Allergies, and preventative health maintenance was reviewed and updated. ? ? ? ?Cardiac Risk Factors include: advanced age (>96mn, >>83women);diabetes mellitus;dyslipidemia;male gender;hypertension;obesity (BMI >30kg/m2);smoking/ tobacco exposure ? ?   ?Objective:  ?  ?Today's Vitals  ? 07/25/21 1244 07/25/21 1245  ?BP:  138/70  ?Pulse:  71  ?Resp:  16  ?SpO2:  95%  ?Weight:  188 lb 12.8 oz (85.6 kg)  ?Height:  '5\' 3"'$  (1.6 m)  ?PainSc: 0-No pain 0-No pain  ? ?Body mass index is 33.44 kg/m?. ? ?Advanced Directives 07/25/2021 04/16/2021 03/22/2020  ?Does Patient Have a Medical Advance Directive? Yes Yes Yes  ?Type of Advance Directive Living will Living will Living will  ?Does patient want to make changes to medical advance directive? No - Patient declined No - Guardian declined -  ? ? ?Current Medications (verified) ?Outpatient Encounter Medications as of 07/25/2021  ?Medication Sig  ? amLODipine (NORVASC) 10 MG tablet TAKE 1 TABLET BY MOUTH ONCE DAILY  ? aspirin 81 MG tablet Take 81 mg by mouth daily.  ? finasteride (PROSCAR) 5 MG tablet Take 1 tablet (5 mg total) by mouth daily.  ? glimepiride (AMARYL) 4 MG tablet TAKE 1 TABLET BY MOUTH  DAILY  ? linaclotide (LINZESS) 72 MCG capsule Take 1 capsule (72 mcg total) by mouth daily before breakfast.  ? lisinopril-hydrochlorothiazide (ZESTORETIC) 20-12.5 MG tablet Take 1 tablet by mouth daily.  ? metFORMIN (GLUCOPHAGE) 500 MG tablet TAKE 1 TABLET BY MOUTH  TWICE DAILY  ? nitroGLYCERIN (NITROSTAT) 0.4 MG SL tablet Place  1 tablet (0.4 mg total) under the tongue every 5 (five) minutes as needed for chest pain.  ? omeprazole (PRILOSEC) 20 MG capsule TAKE 1 CAPSULE BY MOUTH  DAILY  ? simvastatin (ZOCOR) 40 MG tablet TAKE 1 TABLET BY MOUTH ONCE DAILY  ? tamsulosin (FLOMAX) 0.4 MG CAPS capsule TAKE 1 CAPSULE BY MOUTH  ONCE DAILY  ? ?No facility-administered encounter medications on file as of 07/25/2021.  ? ? ?Allergies (verified) ?Patient has no known allergies.  ? ?History: ?Past Medical History:  ?Diagnosis Date  ? Adjustment disorder with depressed mood 06/16/2019  ? BMI 31.0-31.9,adult 10/14/2019  ? Combined hyperlipidemia associated with type 2 diabetes mellitus ([place] 06/16/2019  ? 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 Qualifier: Diagnosis of  By: FRonne Binning   ? Coronary atherosclerosis of native coronary artery   ? 2007 catheterization D1 70%, ramus intermediate 40%, large RV branch off the right coronary artery 60-70%.; Lexiscan Myoview (03/2013):  No ischemia, EF 58% (normal study).  ? Enlarged prostate with urinary obstruction 06/16/2019  ? Essential hypertension 12/30/2008  ? Qualifier: Diagnosis of  By: FRonne Binning   ? Hyperlipidemia   ? Mixed hyperlipidemia 12/30/2008  ? Qualifier: Diagnosis of  By: FRonne Binning   ? Reflux esophagitis 06/16/2019  ? Routine general medical examination at a health care facility 03/22/2020  ? ?Past Surgical History:  ?Procedure Laterality Date  ? ANGIOPLASTY  1994  ? CARDIAC SURGERY    ? TONSILLECTOMY AND ADENOIDECTOMY    ? ?Family History  ?Problem Relation Age of Onset  ? Heart attack Mother   ? Coronary artery disease Mother   ? Lung cancer Father   ? Diabetes Father   ? Hypertension Father   ? Cancer Brother   ? Coronary artery disease Other   ?     paternal side of family  ? Diabetes Brother   ? Drug abuse Brother   ? ?Social History  ? ?Socioeconomic History  ? Marital status: Widowed  ?  Spouse name: Not on  file  ? Number of children: 0  ? Years of education: Not on file  ? Highest education level: Not on file  ?Occupational History  ? Occupation: farmer  ? Occupation: Retired Development worker, community  ?Tobacco Use  ? Smoking status: Former  ?  Types: Cigarettes  ?  Quit date: 10  ?  Years since quitting: 29.2  ? Smokeless tobacco: Current  ?  Types: Chew  ? Tobacco comments:  ?  <1 can a week  ?Vaping Use  ? Vaping Use: Never used  ?Substance and Sexual Activity  ? Alcohol use: Never  ? Drug use: Never  ? Sexual activity: Not Currently  ?Other Topics Concern  ? Not on file  ?Social History Narrative  ? Has one step son  ? ?Social Determinants of Health  ? ?Financial Resource Strain: Not on file  ?Food Insecurity: No Food Insecurity  ? Worried About Charity fundraiser in the Last Year: Never true  ? Ran Out of Food in the Last Year: Never true  ?Transportation Needs: No Transportation Needs  ? Lack of Transportation (Medical): No  ? Lack of Transportation (Non-Medical): No  ?Physical Activity: Not on file  ?Stress: No Stress Concern Present  ? Feeling of Stress : Not at all  ?Social Connections: Not on file  ? ? ?Tobacco Counseling ?Ready to quit: No ?Counseling given: Not Answered ?Tobacco comments: <1 can a week ? ? ?Clinical Intake: ? ?Pre-visit preparation completed: No ? ?Pain : No/denies pain ?Pain Score: 0-No pain ? ?  ? ?BMI - recorded: 33.44 ?Nutritional Status: BMI > 30  Obese ?Nutritional Risks: None ?Diabetes: Yes (A1C 7.7) ?CBG done?: No ?Did pt. bring in CBG monitor from home?: No ? ?How often do you need to have someone help you when you read instructions, pamphlets, or other written materials from your doctor or pharmacy?: 1 - Never ?Interpreter Needed?: No ?  ? ?Activities of Daily Living ?In your present state of health, do you have any difficulty performing the following activities: 07/25/2021  ?Hearing? N  ?Vision? N  ?Difficulty concentrating or making decisions? N  ?Walking or climbing stairs? N  ?Dressing or  bathing? N  ?Doing errands, shopping? N  ?Preparing Food and eating ? N  ?Using the Toilet? N  ?In the past six months, have you accidently leaked urine? N  ?Do you have problems with loss of bowel control? N  ?Managing your Medications? N  ?Managing your Finances? N  ?Housekeeping or managing your Housekeeping? N  ?Some recent data might be hidden  ? ? ?Patient Care Team: ?Lillard Anes, MD as PCP - General (Family Medicine) ?Nahser, Wonda Cheng, MD as PCP - Cardiology (Cardiology) ?Thana Ates, RN as Case Manager ? ?   ?Assessment:  ? This is a routine wellness examination for Kippy. ? ?Hearing/Vision screen ?No results found. ? ?Dietary  issues and exercise activities discussed: ?Current Exercise Habits: The patient does not participate in regular exercise at present (patient is active around the home and walks 2 miles daily when it is warm outside), Type of exercise: walking, Time (Minutes): 30, Frequency (Times/Week): 3, Weekly Exercise (Minutes/Week): 90, Intensity: Moderate, Exercise limited by: None identified ? ? ?Depression Screen ?PHQ 2/9 Scores 07/25/2021 04/16/2021 03/22/2020 10/14/2019  ?PHQ - 2 Score 0 0 0 0  ?  ?Fall Risk ?Fall Risk  07/25/2021 04/16/2021 03/22/2020 10/14/2019  ?Falls in the past year? 0 0 0 0  ?Number falls in past yr: 0 0 0 0  ?Injury with Fall? 0 0 0 0  ?Risk for fall due to : No Fall Risks - - -  ?Follow up Falls evaluation completed;Education provided;Falls prevention discussed - - Falls evaluation completed  ? ? ?FALL RISK PREVENTION PERTAINING TO THE HOME: ? ?Any stairs in or around the home? No  ?If so, are there any without handrails? No  ?Home free of loose throw rugs in walkways, pet beds, electrical cords, etc? Yes  ?Adequate lighting in your home to reduce risk of falls? Yes  ? ?ASSISTIVE DEVICES UTILIZED TO PREVENT FALLS: ? ?Life alert? No  ?Use of a cane, walker or w/c? No  ?Grab bars in the bathroom? No  ?Shower chair or bench in shower? No  ?Elevated toilet seat or a  handicapped toilet? No  ? ?Gait steady and fast without use of assistive device ? ?Cognitive Function: ?  ?  ?6CIT Screen 07/25/2021  ?What Year? 0 points  ?What month? 0 points  ?What time? 0 points  ?C

## 2021-07-25 NOTE — Patient Instructions (Addendum)

## 2021-08-06 ENCOUNTER — Other Ambulatory Visit: Payer: Self-pay

## 2021-08-06 ENCOUNTER — Encounter: Payer: Self-pay | Admitting: Legal Medicine

## 2021-08-06 ENCOUNTER — Ambulatory Visit (INDEPENDENT_AMBULATORY_CARE_PROVIDER_SITE_OTHER): Payer: Medicare Other | Admitting: Legal Medicine

## 2021-08-06 VITALS — BP 130/80 | HR 70 | Temp 97.8°F | Resp 15 | Ht 63.0 in | Wt 187.0 lb

## 2021-08-06 DIAGNOSIS — K148 Other diseases of tongue: Secondary | ICD-10-CM

## 2021-08-06 NOTE — Progress Notes (Signed)
? ?Acute Office Visit ? ?Subjective:  ? ? Patient ID: Lance Riddle, male    DOB: August 03, 1945, 76 y.o.   MRN: [id number] ? ?Chief Complaint  ?Patient presents with  ? Mass  ?  Under the tongue  ? ? ?HPI: ?Patient is in today for a bump under the tongue. He noticed 3 days ago when he was brushing his teeth. He denied fever, tenderness or discharge. He dips snuff until 8 weeks ago. ? ?Past Medical History:  ?Diagnosis Date  ? Adjustment disorder with depressed mood 06/16/2019  ? BMI 31.0-31.9,adult 10/14/2019  ? Combined hyperlipidemia associated with type 2 diabetes mellitus ([place]) 06/16/2019  ? 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 Qualifier: Diagnosis of  By: Ronne Binning    ? Coronary atherosclerosis of native coronary artery   ? 2007 catheterization D1 70%, ramus intermediate 40%, large RV branch off the right coronary artery 60-70%.; Lexiscan Myoview (03/2013):  No ischemia, EF 58% (normal study).  ? Enlarged prostate with urinary obstruction 06/16/2019  ? Essential hypertension 12/30/2008  ? Qualifier: Diagnosis of  By: Ronne Binning    ? Hyperlipidemia   ? Mixed hyperlipidemia 12/30/2008  ? Qualifier: Diagnosis of  By: Ronne Binning    ? Reflux esophagitis 06/16/2019  ? Routine general medical examination at a health care facility 03/22/2020  ? ? ?Past Surgical History:  ?Procedure Laterality Date  ? ANGIOPLASTY  1994  ? CARDIAC SURGERY    ? TONSILLECTOMY AND ADENOIDECTOMY    ? ? ?Family History  ?Problem Relation Age of Onset  ? Heart attack Mother   ? Coronary artery disease Mother   ? Lung cancer Father   ? Diabetes Father   ? Hypertension Father   ? Cancer Brother   ? Coronary artery disease Other   ?     paternal side of family  ? Diabetes Brother   ? Drug abuse Brother   ? ? ?Social History  ? ?Socioeconomic History  ? Marital status: Widowed  ?  Spouse name: Not on file  ? Number of children: 0  ? Years of education: Not on file  ?  Highest education level: Not on file  ?Occupational History  ? Occupation: farmer  ? Occupation: Retired Development worker, community  ?Tobacco Use  ? Smoking status: Former  ?  Types: Cigarettes  ?  Quit date: 40  ?  Years since quitting: 29.2  ? Smokeless tobacco: Current  ?  Types: Chew  ? Tobacco comments:  ?  <1 can a week  ?Vaping Use  ? Vaping Use: Never used  ?Substance and Sexual Activity  ? Alcohol use: Never  ? Drug use: Never  ? Sexual activity: Not Currently  ?Other Topics Concern  ? Not on file  ?Social History Narrative  ? Has one step son  ? ?Social Determinants of Health  ? ?Financial Resource Strain: Not on file  ?Food Insecurity: No Food Insecurity  ? Worried About Charity fundraiser in the Last Year: Never true  ? Ran Out of Food in the Last Year: Never true  ?Transportation Needs: No Transportation Needs  ? Lack of Transportation (Medical): No  ? Lack of Transportation (Non-Medical): No  ?Physical Activity: Not on file  ?Stress: No Stress Concern Present  ? Feeling of Stress : Not at all  ?Social Connections: Not on file  ?Intimate Partner Violence: Not At Risk  ? Fear of Current or  Ex-Partner: No  ? Emotionally Abused: No  ? Physically Abused: No  ? Sexually Abused: No  ? ? ?Outpatient Medications Prior to Visit  ?Medication Sig Dispense Refill  ? amLODipine (NORVASC) 10 MG tablet TAKE 1 TABLET BY MOUTH ONCE DAILY 90 tablet 3  ? aspirin 81 MG tablet Take 81 mg by mouth daily.    ? finasteride (PROSCAR) 5 MG tablet Take 1 tablet (5 mg total) by mouth daily. 90 tablet 3  ? glimepiride (AMARYL) 4 MG tablet TAKE 1 TABLET BY MOUTH  DAILY 90 tablet 2  ? linaclotide (LINZESS) 72 MCG capsule Take 1 capsule (72 mcg total) by mouth daily before breakfast. 30 capsule 6  ? lisinopril-hydrochlorothiazide (ZESTORETIC) 20-12.5 MG tablet Take 1 tablet by mouth daily. 90 tablet 2  ? metFORMIN (GLUCOPHAGE) 500 MG tablet TAKE 1 TABLET BY MOUTH  TWICE DAILY 180 tablet 2  ? nitroGLYCERIN (NITROSTAT) 0.4 MG SL tablet Place 1 tablet  (0.4 mg total) under the tongue every 5 (five) minutes as needed for chest pain. 25 tablet 3  ? omeprazole (PRILOSEC) 20 MG capsule TAKE 1 CAPSULE BY MOUTH  DAILY 90 capsule 3  ? simvastatin (ZOCOR) 40 MG tablet TAKE 1 TABLET BY MOUTH ONCE DAILY 90 tablet 3  ? tamsulosin (FLOMAX) 0.4 MG CAPS capsule TAKE 1 CAPSULE BY MOUTH  ONCE DAILY 90 capsule 2  ? ?No facility-administered medications prior to visit.  ? ? ?No Known Allergies ? ?Review of Systems  ?Constitutional:  Negative for chills, fatigue, fever and unexpected weight change.  ?HENT:  Negative for congestion, ear pain, sinus pain and sore throat.   ?     Bump under the tongue.  ?Eyes:  Negative for visual disturbance.  ?Respiratory:  Negative for cough and shortness of breath.   ?Cardiovascular:  Negative for chest pain and palpitations.  ?Gastrointestinal:  Negative for abdominal pain, blood in stool, constipation, diarrhea, nausea and vomiting.  ?Endocrine: Negative for polydipsia.  ?Genitourinary:  Negative for dysuria.  ?Musculoskeletal:  Negative for back pain.  ?Skin:  Negative for rash.  ?Neurological:  Negative for headaches.  ?Psychiatric/Behavioral: Negative.    ? ?   ?Objective:  ?  ?Physical Exam ?Vitals reviewed.  ?Constitutional:   ?   General: He is in acute distress.  ?   Appearance: Normal appearance.  ?HENT:  ?   Right Ear: Tympanic membrane normal.  ?   Left Ear: Tympanic membrane normal.  ?   Mouth/Throat:  ?   Pharynx: Oropharynx is clear.  ?   Comments: Growth on frenulum tongue that is red , non tender, no cervical nodes ?Eyes:  ?   Extraocular Movements: Extraocular movements intact.  ?   Conjunctiva/sclera: Conjunctivae normal.  ?   Pupils: Pupils are equal, round, and reactive to light.  ?Cardiovascular:  ?   Rate and Rhythm: Normal rate and regular rhythm.  ?   Pulses: Normal pulses.  ?   Heart sounds: Normal heart sounds. No murmur heard. ?  No gallop.  ?Pulmonary:  ?   Effort: Pulmonary effort is normal. No respiratory distress.  ?    Breath sounds: Normal breath sounds. No wheezing.  ?Abdominal:  ?   General: Abdomen is flat. Bowel sounds are normal. There is no distension.  ?Musculoskeletal:  ?   Right lower leg: No edema.  ?   Left lower leg: No edema.  ?Lymphadenopathy:  ?   Cervical: No cervical adenopathy.  ?Skin: ?   Capillary Refill: Capillary refill  takes less than 2 seconds.  ?   Findings: No lesion or rash.  ?Neurological:  ?   General: No focal deficit present.  ?   Mental Status: He is alert and oriented to person, place, and time.  ?   Gait: Gait normal.  ?   Deep Tendon Reflexes: Reflexes normal.  ? ? ?BP 130/80   Pulse 70   Temp 97.8 ?F (36.6 ?C)   Resp 15   Ht '5\' 3"'  (1.6 m)   Wt 187 lb (84.8 kg)   SpO2 98%   BMI 33.13 kg/m?  ?Wt Readings from Last 3 Encounters:  ?08/06/21 187 lb (84.8 kg)  ?07/25/21 188 lb 12.8 oz (85.6 kg)  ?07/16/21 186 lb (84.4 kg)  ? ? ?Health Maintenance Due  ?Topic Date Due  ? Hepatitis C Screening  Never done  ? Zoster Vaccines- Shingrix (1 of 2) Never done  ? Pneumonia Vaccine 72+ Years old (2 - PCV) 04/09/2018  ? COVID-19 Vaccine (4 - Booster for Moderna series) 07/25/2020  ? TETANUS/TDAP  06/15/2021  ? ? ?There are no preventive care reminders to display for this patient. ? ? ?Lab Results  ?Component Value Date  ? TSH 1.180 07/16/2021  ? ?Lab Results  ?Component Value Date  ? WBC 10.2 07/16/2021  ? HGB 13.7 07/16/2021  ? HCT 41.7 07/16/2021  ? MCV 88 07/16/2021  ? PLT 243 07/16/2021  ? ?Lab Results  ?Component Value Date  ? NA 126 (L) 07/16/2021  ? K 5.1 07/16/2021  ? CO2 22 07/16/2021  ? GLUCOSE 136 (H) 07/16/2021  ? BUN 14 07/16/2021  ? CREATININE 1.05 07/16/2021  ? BILITOT 0.4 07/16/2021  ? ALKPHOS 55 07/16/2021  ? AST 13 07/16/2021  ? ALT 19 07/16/2021  ? PROT 7.1 07/16/2021  ? ALBUMIN 4.6 07/16/2021  ? CALCIUM 9.5 07/16/2021  ? EGFR 74 07/16/2021  ? ?Lab Results  ?Component Value Date  ? CHOL 141 07/16/2021  ? ?Lab Results  ?Component Value Date  ? HDL 56 07/16/2021  ? ?Lab Results   ?Component Value Date  ? [place] 66 07/16/2021  ? ?Lab Results  ?Component Value Date  ? TRIG 101 07/16/2021  ? ?Lab Results  ?Component Value Date  ? CHOLHDL 2.5 07/16/2021  ? ?Lab Results  ?Component Value Date

## 2021-08-08 ENCOUNTER — Other Ambulatory Visit: Payer: Self-pay | Admitting: Legal Medicine

## 2021-08-08 DIAGNOSIS — K5909 Other constipation: Secondary | ICD-10-CM

## 2021-08-09 DIAGNOSIS — I10 Essential (primary) hypertension: Secondary | ICD-10-CM

## 2021-08-09 DIAGNOSIS — E1169 Type 2 diabetes mellitus with other specified complication: Secondary | ICD-10-CM

## 2021-08-09 NOTE — Telephone Encounter (Signed)
Refill sent to pharmacy.   

## 2021-10-01 DIAGNOSIS — E119 Type 2 diabetes mellitus without complications: Secondary | ICD-10-CM | POA: Diagnosis not present

## 2021-10-17 ENCOUNTER — Ambulatory Visit (INDEPENDENT_AMBULATORY_CARE_PROVIDER_SITE_OTHER): Payer: Medicare Other

## 2021-10-17 DIAGNOSIS — E0800 Diabetes mellitus due to underlying condition with hyperosmolarity without nonketotic hyperglycemic-hyperosmolar coma (NKHHC): Secondary | ICD-10-CM

## 2021-10-17 DIAGNOSIS — I10 Essential (primary) hypertension: Secondary | ICD-10-CM

## 2021-10-17 NOTE — Patient Instructions (Addendum)
Visit Information  Thank you for taking time to visit with me today. Please don't hesitate to contact me if I can be of assistance to you before our next scheduled telephone appointment.  Following are the goals we discussed today:  Take medications as prescribed   Attend all scheduled provider appointments Call pharmacy for medication refills 3-7 days in advance of running out of medications Call provider office for new concerns or questions  check feet daily for cuts, sores or redness take the blood sugar log to all doctor visits trim toenails straight across drink 6 to 8 glasses of water each day wash and dry feet carefully every day wear comfortable, cotton socks wear comfortable, well-fitting shoes write blood pressure results in a log or diary keep a blood pressure log take blood pressure log to all doctor appointments call doctor for signs and symptoms of high blood pressure take medications for blood pressure exactly as prescribed  Our next appointment is by telephone on 11/14/2021 at [facility]  Please call the care guide team at [phone] if you need to cancel or reschedule your appointment.   If you are experiencing a Mental Health or [place] or need someone to talk to, please call the Suicide and Crisis Lifeline: 988 call the [place] National Suicide Prevention Lifeline: 220-394-5976 or TTY: 207-671-0994 TTY (276)718-2677) to talk to a trained counselor call 1-800-273-TALK (toll free, 24 hour hotline) call 911   The patient verbalized understanding of instructions, educational materials, and care plan provided today and agreed to receive a mailed copy of patient instructions, educational materials, and care plan.   Tomasa Rand RN, BSN, CEN RN Case Freight forwarder - [employer] exhibitions officer Mobile: [phone]

## 2021-10-17 NOTE — Chronic Care Management (AMB) (Signed)
Chronic Care Management   CCM RN Visit Note  10/17/2021 Name: Lance Riddle MRN: [id number] DOB: Dec 29, 1945  Subjective: Lance Riddle is a 76 y.o. year old male who is a primary care patient of Lillard Anes, MD. The care management team was consulted for assistance with disease management and care coordination needs.    Engaged with patient by telephone for follow up visit in response to provider referral for case management and/or care coordination services.   Consent to Services:  The patient was given information about Chronic Care Management services, agreed to services, and gave verbal consent prior to initiation of services.  Please see initial visit note for detailed documentation.   Patient agreed to services and verbal consent obtained.   Assessment: Review of patient past medical history, allergies, medications, health status, including review of consultants reports, laboratory and other test data, was performed as part of comprehensive evaluation and provision of chronic care management services.   SDOH (Social Determinants of Health) assessments and interventions performed:    CCM Care Plan  No Known Allergies  Outpatient Encounter Medications as of 10/17/2021  Medication Sig   amLODipine (NORVASC) 10 MG tablet TAKE 1 TABLET BY MOUTH ONCE DAILY   aspirin 81 MG tablet Take 81 mg by mouth daily.   finasteride (PROSCAR) 5 MG tablet Take 1 tablet (5 mg total) by mouth daily.   glimepiride (AMARYL) 4 MG tablet TAKE 1 TABLET BY MOUTH  DAILY   LINZESS 72 MCG capsule TAKE 1 CAPSULE BY MOUTH DAILY  BEFORE BREAKFAST   lisinopril-hydrochlorothiazide (ZESTORETIC) 20-12.5 MG tablet Take 1 tablet by mouth daily.   metFORMIN (GLUCOPHAGE) 500 MG tablet TAKE 1 TABLET BY MOUTH  TWICE DAILY   nitroGLYCERIN (NITROSTAT) 0.4 MG SL tablet Place 1 tablet (0.4 mg total) under the tongue every 5 (five) minutes as needed for chest pain.   omeprazole (PRILOSEC) 20 MG capsule TAKE 1  CAPSULE BY MOUTH  DAILY   simvastatin (ZOCOR) 40 MG tablet TAKE 1 TABLET BY MOUTH ONCE DAILY   tamsulosin (FLOMAX) 0.4 MG CAPS capsule TAKE 1 CAPSULE BY MOUTH  ONCE DAILY   No facility-administered encounter medications on file as of 10/17/2021.    Patient Active Problem List   Diagnosis Date Noted   BMI 32.0-32.9,adult 10/14/2019   Combined hyperlipidemia associated with type 2 diabetes mellitus ([place]) 06/16/2019   Enlarged prostate with urinary obstruction 06/16/2019   Reflux esophagitis 06/16/2019   Adjustment disorder with depressed mood 06/16/2019   Coronary artery disease involving native coronary artery of native heart without angina pectoris 12/19/2009   Mixed hyperlipidemia 12/30/2008   Essential hypertension 12/30/2008    Conditions to be addressed/monitored:HTN, DMII, and lesion on tongue  Care Plan : RN Care Manager Plan of Care  Updates made by Thana Ates, RN since 10/17/2021 12:00 AM     Problem: No plan of care established for the management of chronic disease states ( DM, HTN)   Priority: High     Long-Range Goal: Development of plan of care for the management of chronic disease states ( DM, HTN)   Start Date: 07/18/2021  Expected End Date: 07/19/2022  Priority: High  Note:   Current Barriers:  Chronic Disease Management support and education needs related to HTN and DMII 07/18/2021  DM- reports fasting CBG of 120-130.  Today's CBG 112. Reports that he is taking all his medications as prescribed. Denies any lows. HTN- reports BP has been good. Continues to take his medications  as prescribed.  10/17/2021 DM: Reports cbgs have been up some.  150-160. Todays reading of 147.  BP: reports it has been good as far as he knows. Denies self monitoring.  Reports that he continues to take all his medications as prescribed and has no problems with medications.  Reports that he is planning to mow today.  States that he continues to have a non painful lesion on his tongue. Reports  it has not grown any. Has an initial appointment with ENT on Wednesday June 14th at 1:40pm.  Reports that he has transportation. Denies any other concerns at this time. Is aware of pending appointment with PCP.  RNCM Clinical Goal(s):  Patient will demonstrate ongoing adherence to prescribed treatment plan for HTN and DMII as evidenced by patients verbal report and review of medical recordand  through collaboration with RN Care manager, provider, and care team.   Interventions: 1:1 collaboration with primary care provider regarding development and update of comprehensive plan of care as evidenced by provider attestation and co-signature Inter-disciplinary care team collaboration (see longitudinal plan of care) Evaluation of current treatment plan related to  self management and patient's adherence to plan as established by provider   Diabetes:  (Status: Goal on Track (progressing): YES.) Long Term Goal   Lab Results  Component Value Date   HGBA1C 7.7 (H) 07/16/2021  Assessed patient's understanding of A1c goal: <6.5% Reviewed medications with patient and discussed importance of medication adherence;        Reviewed prescribed diet with patient diabetic diet; Counseled on importance of regular laboratory monitoring as prescribed;        Discussed plans with patient for ongoing care management follow up and provided patient with direct contact information for care management team;      Provided patient with written educational materials related to hypo and hyperglycemia and importance of correct treatment;       Reviewed scheduled/upcoming provider appointments including: PCP and this Care Manager;         Encouraged patient to continue to take his medications as prescribed. Reviewed recent elevated CBG with no reason identified. Reviewed next lab draw with patient and the importance of fasting labs.   Hypertension: (Status: Goal on Track (progressing): YES.) Long Term Goal  Last practice  recorded BP readings:  BP Readings from Last 3 Encounters:  08/06/21 130/80  07/25/21 138/70  07/16/21 100/60  Most recent eGFR/CrCl:  Lab Results  Component Value Date   EGFR 74 07/16/2021    No components found for: "CRCL"  Reviewed medications with patient and discussed importance of compliance;  Discussed plans with patient for ongoing care management follow up and provided patient with direct contact information for care management team; Advised patient, providing education and rationale, to monitor blood pressure daily and record, calling PCP for findings outside established parameters;  Reviewed scheduled/upcoming provider appointments including:  Reviewed with patient when to call MD for dizziness, headache, or other abnormal symptoms for him. Encouraged hydration when working outside.   Patient Goals/Self-Care Activities: Take medications as prescribed   Attend all scheduled provider appointments Call pharmacy for medication refills 3-7 days in advance of running out of medications Call provider office for new concerns or questions  check feet daily for cuts, sores or redness take the blood sugar log to all doctor visits trim toenails straight across drink 6 to 8 glasses of water each day wash and dry feet carefully every day wear comfortable, cotton socks wear comfortable, well-fitting shoes write  blood pressure results in a log or diary keep a blood pressure log take blood pressure log to all doctor appointments call doctor for signs and symptoms of high blood pressure take medications for blood pressure exactly as prescribed       Plan:Telephone follow up appointment with care management team member scheduled for:  11/14/2021 Tomasa Rand RN, BSN, CEN RN Case Manager - [employer] officer Mobile: [phone]

## 2021-10-22 DIAGNOSIS — Z87891 Personal history of nicotine dependence: Secondary | ICD-10-CM | POA: Diagnosis not present

## 2021-10-22 DIAGNOSIS — D1809 Hemangioma of other sites: Secondary | ICD-10-CM | POA: Diagnosis not present

## 2021-11-08 DIAGNOSIS — E1159 Type 2 diabetes mellitus with other circulatory complications: Secondary | ICD-10-CM | POA: Diagnosis not present

## 2021-11-08 DIAGNOSIS — Z7984 Long term (current) use of oral hypoglycemic drugs: Secondary | ICD-10-CM | POA: Diagnosis not present

## 2021-11-08 DIAGNOSIS — I10 Essential (primary) hypertension: Secondary | ICD-10-CM

## 2021-11-14 ENCOUNTER — Telehealth: Payer: Medicare Other

## 2021-11-14 ENCOUNTER — Ambulatory Visit (INDEPENDENT_AMBULATORY_CARE_PROVIDER_SITE_OTHER): Payer: Medicare Other

## 2021-11-14 DIAGNOSIS — I10 Essential (primary) hypertension: Secondary | ICD-10-CM

## 2021-11-14 DIAGNOSIS — E119 Type 2 diabetes mellitus without complications: Secondary | ICD-10-CM

## 2021-11-14 NOTE — Patient Instructions (Signed)
Visit Information  Thank you for taking time to visit with me today. Please don't hesitate to contact me if I can be of assistance to you before our next scheduled telephone appointment.  Following are the goals we discussed today:  Follow up with MD as planned.   If you are experiencing a Mental Health or [place] or need someone to talk to, please call the Suicide and Crisis Lifeline: 988 call the [place] National Suicide Prevention Lifeline: 631-465-9185 or TTY: 310 840 6634 TTY 204-688-7963) to talk to a trained counselor call 1-800-273-TALK (toll free, 24 hour hotline) call 911   The patient verbalized understanding of instructions, educational materials, and care plan provided today and DECLINED offer to receive copy of patient instructions, educational materials, and care plan.   Tomasa Rand RN, BSN, CEN RN Case Freight forwarder - [employer] officer Mobile: [phone]

## 2021-11-14 NOTE — Chronic Care Management (AMB) (Signed)
Chronic Care Management   CCM RN Visit Note  11/14/2021 Name: Lance Riddle MRN: [id number] DOB: 1946/01/03  Subjective: Lance Riddle is a 76 y.o. year old male who is a primary care patient of Lance Anes, MD. The care management team was consulted for assistance with disease management and care coordination needs.    Engaged with patient by telephone for follow up visit in response to provider referral for case management and/or care coordination services.   Consent to Services:  The patient was given information about Chronic Care Management services, agreed to services, and gave verbal consent prior to initiation of services.  Please see initial visit note for detailed documentation.   Patient agreed to services and verbal consent obtained.   Assessment: Review of patient past medical history, allergies, medications, health status, including review of consultants reports, laboratory and other test data, was performed as part of comprehensive evaluation and provision of chronic care management services.   SDOH (Social Determinants of Health) assessments and interventions performed:    CCM Care Plan  No Known Allergies  Outpatient Encounter Medications as of 11/14/2021  Medication Sig   amLODipine (NORVASC) 10 MG tablet TAKE 1 TABLET BY MOUTH ONCE DAILY   aspirin 81 MG tablet Take 81 mg by mouth daily.   finasteride (PROSCAR) 5 MG tablet Take 1 tablet (5 mg total) by mouth daily.   glimepiride (AMARYL) 4 MG tablet TAKE 1 TABLET BY MOUTH  DAILY   LINZESS 72 MCG capsule TAKE 1 CAPSULE BY MOUTH DAILY  BEFORE BREAKFAST   lisinopril-hydrochlorothiazide (ZESTORETIC) 20-12.5 MG tablet Take 1 tablet by mouth daily.   metFORMIN (GLUCOPHAGE) 500 MG tablet TAKE 1 TABLET BY MOUTH  TWICE DAILY   nitroGLYCERIN (NITROSTAT) 0.4 MG SL tablet Place 1 tablet (0.4 mg total) under the tongue every 5 (five) minutes as needed for chest pain.   omeprazole (PRILOSEC) 20 MG capsule TAKE 1  CAPSULE BY MOUTH  DAILY   simvastatin (ZOCOR) 40 MG tablet TAKE 1 TABLET BY MOUTH ONCE DAILY   tamsulosin (FLOMAX) 0.4 MG CAPS capsule TAKE 1 CAPSULE BY MOUTH  ONCE DAILY   No facility-administered encounter medications on file as of 11/14/2021.    Patient Active Problem List   Diagnosis Date Noted   BMI 32.0-32.9,adult 10/14/2019   Combined hyperlipidemia associated with type 2 diabetes mellitus ([place]) 06/16/2019   Enlarged prostate with urinary obstruction 06/16/2019   Reflux esophagitis 06/16/2019   Adjustment disorder with depressed mood 06/16/2019   Coronary artery disease involving native coronary artery of native heart without angina pectoris 12/19/2009   Mixed hyperlipidemia 12/30/2008   Essential hypertension 12/30/2008    Conditions to be addressed/monitored:HTN and DMII  Care Plan : RN Care Manager Plan of Care  Updates made by Thana Ates, RN since 11/14/2021 12:00 AM     Problem: No plan of care established for the management of chronic disease states ( DM, HTN)   Priority: High     Long-Range Goal: Development of plan of care for the management of chronic disease states ( DM, HTN)   Start Date: 07/18/2021  Expected End Date: 07/19/2022  Priority: High  Note:   Current Barriers:  Chronic Disease Management support and education needs related to HTN and DMII 07/18/2021  DM- reports fasting CBG of 120-130.  Today's CBG 112. Reports that he is taking all his medications as prescribed. Denies any lows. HTN- reports BP has been good. Continues to take his medications as prescribed.  10/17/2021 DM: Reports cbgs have been up some.  150-160. Todays reading of 147.  BP: reports it has been good as far as he knows. Denies self monitoring.  Reports that he continues to take all his medications as prescribed and has no problems with medications.  Reports that he is planning to mow today.  States that he continues to have a non painful lesion on his tongue. Reports it has not grown  any. Has an initial appointment with ENT on Wednesday June 14th at 1:40pm.  Reports that he has transportation. Denies any other concerns at this time. Is aware of pending appointment with PCP. 11/14/2021  Patient reports that he is doing well. Reports working in his garden yesterday.  DM: Reports fasting CBG today of 132. Denies any hypoglycemic episodes. Reports taking all his medications as prescribed. HTN: reports his BP have been good when he has gone to MD office. Denies self monitoring. Reports taking medications as prescribed.   RNCM Clinical Goal(s):  Patient will demonstrate ongoing adherence to prescribed treatment plan for HTN and DMII as evidenced by patients verbal report and review of medical recordand  through collaboration with RN Care manager, provider, and care team.   Interventions: 1:1 collaboration with primary care provider regarding development and update of comprehensive plan of care as evidenced by provider attestation and co-signature Inter-disciplinary care team collaboration (see longitudinal plan of care) Evaluation of current treatment plan related to  self management and patient's adherence to plan as established by provider   Diabetes:  (Status: Goal Met.) Long Term Goal   Lab Results  Component Value Date   HGBA1C 7.7 (H) 07/16/2021  Assessed patient's understanding of A1c goal: <6.5% Reviewed medications with patient and discussed importance of medication adherence;        Reviewed prescribed diet with patient diabetic diet; Counseled on importance of regular laboratory monitoring as prescribed;        Discussed plans with patient for ongoing care management follow up and provided patient with direct contact information for care management team;      Provided patient with written educational materials related to hypo and hyperglycemia and importance of correct treatment;       Reviewed scheduled/upcoming provider appointments including: PCP;            Hypertension: (Status: Goal Met.) Long Term Goal  Last practice recorded BP readings:  BP Readings from Last 3 Encounters:  08/06/21 130/80  07/25/21 138/70  07/16/21 100/60  Most recent eGFR/CrCl:  Lab Results  Component Value Date   EGFR 74 07/16/2021      Reviewed medications with patient and discussed importance of compliance;  Discussed plans with patient for ongoing care management follow up and provided patient with direct contact information for care management team; Advised patient, providing education and rationale, to monitor blood pressure daily and record, calling PCP for findings outside established parameters;  Reviewed scheduled/upcoming provider appointments including:  Reviewed with patient when to call MD for dizziness, headache, or other abnormal symptoms for him. Encouraged hydration when working outside.   Patient Goals/Self-Care Activities: Take medications as prescribed   Attend all scheduled provider appointments Call pharmacy for medication refills 3-7 days in advance of running out of medications Call provider office for new concerns or questions  check feet daily for cuts, sores or redness take the blood sugar log to all doctor visits trim toenails straight across drink 6 to 8 glasses of water each day wash and dry feet carefully every  day wear comfortable, cotton socks wear comfortable, well-fitting shoes write blood pressure results in a log or diary keep a blood pressure log take blood pressure log to all doctor appointments call doctor for signs and symptoms of high blood pressure take medications for blood pressure exactly as prescribed Attend schedule appointments       Plan:No further follow up required: Keep scheduled MD appointments Tomasa Rand RN, BSN, CEN RN Case Manager - [employer] Mobile: [phone]

## 2021-11-19 NOTE — Progress Notes (Unsigned)
Subjective:  Patient ID: Lance Riddle, male    DOB: [birth year]  Age: 76 y.o. MRN: [id number]  No chief complaint on file.   HPI Patient present with type 2 diabetes. Compliance with treatment has been good; patient take medicines as directed, maintains diet and exercise regimen, follows up as directed, and is keeping glucose diary. Current medicines for diabetes Meformin 500 mg twice a day, Glimepiride 4 mg daily.  Patient performs foot exams daily and last ophthalmologic exam was 02/06/2021. Last A1C 7.7%.  Patient presents with hyperlipidemia.  Compliance with treatment has been good; patient takes medicines as directed, maintains low cholesterol diet, follows up as directed, and maintains exercise regimen.  Patient is using Simvastatin mg daily  without problems.   Patient presents for follow up of hypertension.  Patient tolerating Amlodipine 10 mg daily, Aspirin 81 mg daily, Lisinopril-HCTZ 20-12.5 mg daily well without side effects. Patient is working on maintaining diet and exercise regimen and follows up as directed.   BPH: Taking Finasteride 5 mg daily Tamsulosin 0.4 mg daily.    Current Outpatient Medications on File Prior to Visit  Medication Sig Dispense Refill   amLODipine (NORVASC) 10 MG tablet TAKE 1 TABLET BY MOUTH ONCE DAILY 90 tablet 3   aspirin 81 MG tablet Take 81 mg by mouth daily.     finasteride (PROSCAR) 5 MG tablet Take 1 tablet (5 mg total) by mouth daily. 90 tablet 3   glimepiride (AMARYL) 4 MG tablet TAKE 1 TABLET BY MOUTH  DAILY 90 tablet 2   LINZESS 72 MCG capsule TAKE 1 CAPSULE BY MOUTH DAILY  BEFORE BREAKFAST 90 capsule 3   lisinopril-hydrochlorothiazide (ZESTORETIC) 20-12.5 MG tablet Take 1 tablet by mouth daily. 90 tablet 2   metFORMIN (GLUCOPHAGE) 500 MG tablet TAKE 1 TABLET BY MOUTH  TWICE DAILY 180 tablet 2   nitroGLYCERIN (NITROSTAT) 0.4 MG SL tablet Place 1 tablet (0.4 mg total) under the tongue every 5 (five) minutes as needed for chest pain. 25  tablet 3   omeprazole (PRILOSEC) 20 MG capsule TAKE 1 CAPSULE BY MOUTH  DAILY 90 capsule 3   simvastatin (ZOCOR) 40 MG tablet TAKE 1 TABLET BY MOUTH ONCE DAILY 90 tablet 3   tamsulosin (FLOMAX) 0.4 MG CAPS capsule TAKE 1 CAPSULE BY MOUTH  ONCE DAILY 90 capsule 2   No current facility-administered medications on file prior to visit.   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   Enlarged prostate with urinary obstruction 06/16/2019   Essential hypertension 12/30/2008   Qualifier: Diagnosis of  By: Orville Govern, CMA, Carol     Hyperlipidemia    Mixed hyperlipidemia 12/30/2008   Qualifier: Diagnosis of  By: Orville Govern, CMA, Carol     Reflux esophagitis 06/16/2019   Routine general medical examination at a health care facility 03/22/2020   Past Surgical History:  Procedure Laterality Date   [place]  Family History  Problem Relation Age of Onset   Heart attack Mother    Coronary artery disease Mother    Lung cancer Father    Diabetes Father    Hypertension Father    Cancer Brother    Coronary artery disease Other        paternal side of family   Diabetes Brother    Drug abuse Brother    Social History   Socioeconomic History   Marital status: Widowed    Spouse name: Not on file   Number of children: 0   Years of education: Not on file   Highest education level: Not on file   Occupational History   Occupation: farmer   Occupation: Retired Development worker, community  Tobacco Use   Smoking status: Former    Types: Cigarettes    Quit date: 1994    Years since quitting: 29.5   Smokeless tobacco: Current    Types: Chew   Tobacco comments:    <1 can a week  Vaping Use   Vaping Use: Never used  Substance and Sexual Activity   Alcohol use: Never   Drug use: Never   Sexual activity: Not Currently  Other Topics Concern   Not on file  Social History Narrative   Has one step son   Social Determinants of Health   Financial Resource Strain: Low Risk  (03/22/2020)   Overall Financial Resource Strain (CARDIA)    Difficulty of Paying Living Expenses: Not hard at all  Food Insecurity: No Food Insecurity (07/25/2021)   Hunger Vital Sign    Worried About Running Out of Food in the Last Year: Never true    [place] in the Last Year: Never true  Transportation Needs: No Transportation Needs (07/25/2021)   PRAPARE - Hydrologist (Medical): No    Lack of Transportation (Non-Medical): No  Physical Activity: Sufficiently Active (03/22/2020)   Exercise Vital Sign    Days of Exercise per Week: 3 days    Minutes of Exercise per Session: 60 min  Stress: No Stress Concern Present (07/25/2021)   [place]    Feeling of Stress : Not at all  Social Connections: Moderately Isolated (03/22/2020)   Social Connection and Isolation Panel [NHANES]    Frequency of Communication with Friends and Family: Twice a week    Frequency of Social Gatherings with Friends and Family: Twice a week    Attends Religious Services: More than 4 times per year    Active Member of [employer] or Organizations: No    Attends Archivist Meetings: Never    Marital Status: Widowed    Review of Systems   Objective:  There were no vitals taken for this visit.     08/06/2021    2:04 PM 07/25/2021   12:45 PM  07/16/2021    9:11 AM  BP/Weight  Systolic BP 627 035 009  Diastolic BP 80 70 60  Wt. (Lbs) 187 188.8 186  BMI 33.13 kg/m2 33.44 kg/m2 32.95 kg/m2    Physical Exam  Diabetic Foot Exam - Simple   No data filed      Lab Results  Component Value Date   WBC 10.2 07/16/2021   HGB 13.7 07/16/2021   HCT 41.7 07/16/2021   PLT 243 07/16/2021   GLUCOSE 136 (H) 07/16/2021   CHOL 141 07/16/2021   TRIG 101 07/16/2021   HDL 56 07/16/2021   [place]  66 07/16/2021   ALT 19 07/16/2021   AST 13 07/16/2021   NA 126 (L) 07/16/2021   K 5.1 07/16/2021   CL 89 (L) 07/16/2021   CREATININE 1.05 07/16/2021   BUN 14 07/16/2021   CO2 22 07/16/2021   TSH 1.180 07/16/2021   HGBA1C 7.7 (H) 07/16/2021   MICROALBUR 10 10/23/2020      Assessment & Plan:   Problem List Items Addressed This Visit       Cardiovascular and Mediastinum   Essential hypertension - Primary   Coronary artery disease involving native coronary artery of native heart without angina pectoris     Digestive   Reflux esophagitis (Chronic)     Endocrine   Combined hyperlipidemia associated with type 2 diabetes mellitus (HCC) (Chronic)     Genitourinary   Enlarged prostate with urinary obstruction (Chronic)     Other   Mixed hyperlipidemia   Adjustment disorder with depressed mood  .  No orders of the defined types were placed in this encounter.   No orders of the defined types were placed in this encounter.    Follow-up: No follow-ups on file.  An After Visit Summary was printed and given to the patient.  Reinaldo Meeker, MD [facility] [phone]

## 2021-11-20 ENCOUNTER — Ambulatory Visit (INDEPENDENT_AMBULATORY_CARE_PROVIDER_SITE_OTHER): Payer: Medicare Other | Admitting: Legal Medicine

## 2021-11-20 ENCOUNTER — Encounter: Payer: Self-pay | Admitting: Legal Medicine

## 2021-11-20 VITALS — BP 134/64 | HR 71 | Temp 97.3°F | Ht 63.0 in | Wt 191.0 lb

## 2021-11-20 DIAGNOSIS — E1159 Type 2 diabetes mellitus with other circulatory complications: Secondary | ICD-10-CM

## 2021-11-20 DIAGNOSIS — I2510 Atherosclerotic heart disease of native coronary artery without angina pectoris: Secondary | ICD-10-CM | POA: Diagnosis not present

## 2021-11-20 DIAGNOSIS — E669 Obesity, unspecified: Secondary | ICD-10-CM

## 2021-11-20 DIAGNOSIS — E1169 Type 2 diabetes mellitus with other specified complication: Secondary | ICD-10-CM | POA: Diagnosis not present

## 2021-11-20 DIAGNOSIS — Z1159 Encounter for screening for other viral diseases: Secondary | ICD-10-CM

## 2021-11-20 DIAGNOSIS — I10 Essential (primary) hypertension: Secondary | ICD-10-CM

## 2021-11-20 DIAGNOSIS — K2100 Gastro-esophageal reflux disease with esophagitis, without bleeding: Secondary | ICD-10-CM

## 2021-11-20 DIAGNOSIS — E782 Mixed hyperlipidemia: Secondary | ICD-10-CM | POA: Diagnosis not present

## 2021-11-20 DIAGNOSIS — N138 Other obstructive and reflux uropathy: Secondary | ICD-10-CM | POA: Diagnosis not present

## 2021-11-20 DIAGNOSIS — N401 Enlarged prostate with lower urinary tract symptoms: Secondary | ICD-10-CM

## 2021-11-20 DIAGNOSIS — I152 Hypertension secondary to endocrine disorders: Secondary | ICD-10-CM

## 2021-11-20 DIAGNOSIS — F4321 Adjustment disorder with depressed mood: Secondary | ICD-10-CM

## 2021-11-20 DIAGNOSIS — Z6832 Body mass index (BMI) 32.0-32.9, adult: Secondary | ICD-10-CM

## 2021-11-20 MED ORDER — SIMVASTATIN 40 MG PO TABS: 40.0000 mg | ORAL_TABLET | Freq: Every day | ORAL | 3 refills | Status: DC

## 2021-11-20 MED ORDER — GLIMEPIRIDE 4 MG PO TABS: 4.0000 mg | ORAL_TABLET | Freq: Every day | ORAL | 2 refills | Status: DC

## 2021-11-20 MED ORDER — AMLODIPINE BESYLATE 10 MG PO TABS: 10.0000 mg | ORAL_TABLET | Freq: Every day | ORAL | 3 refills | Status: DC

## 2021-11-20 MED ORDER — TAMSULOSIN HCL 0.4 MG PO CAPS: 0.4000 mg | ORAL_CAPSULE | Freq: Every day | ORAL | 2 refills | Status: DC

## 2021-11-20 MED ORDER — LISINOPRIL-HYDROCHLOROTHIAZIDE 20-12.5 MG PO TABS: 1.0000 | ORAL_TABLET | Freq: Every day | ORAL | 2 refills | Status: DC

## 2021-11-20 MED ORDER — FINASTERIDE 5 MG PO TABS: 5.0000 mg | ORAL_TABLET | Freq: Every day | ORAL | 3 refills | Status: DC

## 2021-11-20 MED ORDER — OMEPRAZOLE 20 MG PO CPDR: 20.0000 mg | DELAYED_RELEASE_CAPSULE | Freq: Every day | ORAL | 1 refills | Status: DC

## 2021-11-20 MED ORDER — METFORMIN HCL 500 MG PO TABS: 500.0000 mg | ORAL_TABLET | Freq: Two times a day (BID) | ORAL | 2 refills | Status: DC

## 2021-11-21 ENCOUNTER — Other Ambulatory Visit: Payer: Self-pay | Admitting: Legal Medicine

## 2021-11-21 DIAGNOSIS — N401 Enlarged prostate with lower urinary tract symptoms: Secondary | ICD-10-CM

## 2021-11-21 DIAGNOSIS — E669 Obesity, unspecified: Secondary | ICD-10-CM

## 2021-11-21 DIAGNOSIS — I2510 Atherosclerotic heart disease of native coronary artery without angina pectoris: Secondary | ICD-10-CM

## 2021-11-21 DIAGNOSIS — I10 Essential (primary) hypertension: Secondary | ICD-10-CM

## 2021-11-21 LAB — CBC WITH DIFFERENTIAL/PLATELET
Basophils Absolute: 0.1 10*3/uL (ref 0.0–0.2)
Basos: 1 %
EOS (ABSOLUTE): 0.3 10*3/uL (ref 0.0–0.4)
Eos: 4 %
Hematocrit: 41.4 % (ref 37.5–51.0)
Hemoglobin: 13.3 g/dL (ref 13.0–17.7)
Immature Grans (Abs): 0 10*3/uL (ref 0.0–0.1)
Immature Granulocytes: 0 %
Lymphocytes Absolute: 2.8 10*3/uL (ref 0.7–3.1)
Lymphs: 34 %
MCH: 28.7 pg (ref 26.6–33.0)
MCHC: 32.1 g/dL (ref 31.5–35.7)
MCV: 89 fL (ref 79–97)
Monocytes Absolute: 0.9 10*3/uL (ref 0.1–0.9)
Monocytes: 11 %
Neutrophils Absolute: 4.1 10*3/uL (ref 1.4–7.0)
Neutrophils: 50 %
Platelets: 232 10*3/uL (ref 150–450)
RBC: 4.64 x10E6/uL (ref 4.14–5.80)
RDW: 13.9 % (ref 11.6–15.4)
WBC: 8.1 10*3/uL (ref 3.4–10.8)

## 2021-11-21 LAB — LIPID PANEL
Chol/HDL Ratio: 2.5 ratio (ref 0.0–5.0)
Cholesterol, Total: 144 mg/dL (ref 100–199)
HDL: 57 mg/dL (ref >39)
LDL Chol Calc (NIH): 69 mg/dL (ref 0–99)
Triglycerides: 99 mg/dL (ref 0–149)
VLDL Cholesterol Cal: 18 mg/dL (ref 5–40)

## 2021-11-21 LAB — MICROALBUMIN / CREATININE URINE RATIO
Creatinine, Urine: 26.4 mg/dL
Microalb/Creat Ratio: 51 mg/g creat — ABNORMAL HIGH (ref 0–29)
Microalbumin, Urine: 13.5 ug/mL

## 2021-11-21 LAB — COMPREHENSIVE METABOLIC PANEL
ALT: 19 IU/L (ref 0–44)
AST: 13 IU/L (ref 0–40)
Albumin/Globulin Ratio: 1.9 (ref 1.2–2.2)
Albumin: 4.7 g/dL (ref 3.8–4.8)
Alkaline Phosphatase: 45 IU/L (ref 44–121)
BUN/Creatinine Ratio: 14 (ref 10–24)
BUN: 15 mg/dL (ref 8–27)
Bilirubin Total: 0.3 mg/dL (ref 0.0–1.2)
CO2: 20 mmol/L (ref 20–29)
Calcium: 10 mg/dL (ref 8.6–10.2)
Chloride: 95 mmol/L — ABNORMAL LOW (ref 96–106)
Creatinine, Ser: 1.06 mg/dL (ref 0.76–1.27)
Globulin, Total: 2.5 g/dL (ref 1.5–4.5)
Glucose: 164 mg/dL — ABNORMAL HIGH (ref 70–99)
Potassium: 5.2 mmol/L (ref 3.5–5.2)
Sodium: 130 mmol/L — ABNORMAL LOW (ref 134–144)
Total Protein: 7.2 g/dL (ref 6.0–8.5)
eGFR: 74 mL/min/{1.73_m2} (ref >59)

## 2021-11-21 LAB — PSA: Prostate Specific Ag, Serum: 0.4 ng/mL (ref 0.0–4.0)

## 2021-11-21 LAB — HEMOGLOBIN A1C
Est. average glucose Bld gHb Est-mCnc: 174 mg/dL
Hgb A1c MFr Bld: 7.7 % — ABNORMAL HIGH (ref 4.8–5.6)

## 2021-11-21 LAB — HEPATITIS C ANTIBODY: Hep C Virus Ab: NONREACTIVE

## 2021-11-21 LAB — CARDIOVASCULAR RISK ASSESSMENT

## 2021-11-21 NOTE — Progress Notes (Signed)
Glucose 164, kidney tests normal, liver tests normal, A1c 7.7, Microabuminuria normal, Cholesterol normal, cbc normal, PSA 0.4 normal lp

## 2021-11-21 NOTE — Progress Notes (Signed)
Hepatitis c negative lp

## 2021-11-27 ENCOUNTER — Other Ambulatory Visit: Payer: Self-pay

## 2021-12-06 ENCOUNTER — Other Ambulatory Visit: Payer: Self-pay | Admitting: Legal Medicine

## 2021-12-06 DIAGNOSIS — K2100 Gastro-esophageal reflux disease with esophagitis, without bleeding: Secondary | ICD-10-CM

## 2021-12-06 DIAGNOSIS — I152 Hypertension secondary to endocrine disorders: Secondary | ICD-10-CM

## 2021-12-09 DIAGNOSIS — I10 Essential (primary) hypertension: Secondary | ICD-10-CM

## 2021-12-09 DIAGNOSIS — E119 Type 2 diabetes mellitus without complications: Secondary | ICD-10-CM

## 2021-12-13 ENCOUNTER — Other Ambulatory Visit: Payer: Self-pay | Admitting: Legal Medicine

## 2021-12-13 DIAGNOSIS — I10 Essential (primary) hypertension: Secondary | ICD-10-CM

## 2021-12-13 DIAGNOSIS — E782 Mixed hyperlipidemia: Secondary | ICD-10-CM

## 2022-01-03 ENCOUNTER — Ambulatory Visit: Payer: Medicare Other | Admitting: Cardiology

## 2022-01-03 ENCOUNTER — Encounter: Payer: Self-pay | Admitting: Cardiology

## 2022-01-03 VITALS — BP 116/66 | HR 67 | Ht 63.0 in | Wt 191.0 lb

## 2022-01-03 DIAGNOSIS — I2510 Atherosclerotic heart disease of native coronary artery without angina pectoris: Secondary | ICD-10-CM

## 2022-01-03 DIAGNOSIS — E782 Mixed hyperlipidemia: Secondary | ICD-10-CM

## 2022-01-03 DIAGNOSIS — I10 Essential (primary) hypertension: Secondary | ICD-10-CM | POA: Diagnosis not present

## 2022-01-03 DIAGNOSIS — E1169 Type 2 diabetes mellitus with other specified complication: Secondary | ICD-10-CM

## 2022-01-03 NOTE — Progress Notes (Signed)
Cardiology Office Note:    Date:  01/03/2022   ID:  Bradley, Bostelman 1945/12/13, MRN [id number]  PCP:  Lillard Anes, MD  Cardiologist:  Jenean Lindau, MD   Referring MD: Lillard Anes,*    ASSESSMENT:    1. Coronary artery disease involving native coronary artery of native heart without angina pectoris   2. Combined hyperlipidemia associated with type 2 diabetes mellitus ([place])   3. Essential hypertension   4. Mixed hyperlipidemia    PLAN:    In order of problems listed above:  Coronary artery disease: Secondary prevention stressed with the patient.  Importance of compliance with diet and medication stressed and he vocalized understanding.  He was advised to walk at least 2 miles a day 5 days a week and he promises to do so. Essential hypertension: Blood pressure stable and diet was emphasized.  Lifestyle modification urged. Dyslipidemia: Lipids reviewed and discussed with the patient at length. Diabetes mellitus and obesity: Weight reduction stressed diet emphasized and he is going to pay particular attention to this.  He will be seen in follow-up appointment for annual basis.   Medication Adjustments/Labs and Tests Ordered: Current medicines are reviewed at length with the patient today.  Concerns regarding medicines are outlined above.  No orders of the defined types were placed in this encounter.  No orders of the defined types were placed in this encounter.    No chief complaint on file.    History of Present Illness:    Lance Riddle is a 76 y.o. male.  Patient has past medical history of coronary artery disease by coronary angiography in 2007, mixed dyslipidemia and essential hypertension.  He leads a sedentary lifestyle.  He is overweight.  He denies any chest pain orthopnea or PND.  He is here for routine yearly follow-up.  At the time of my evaluation, the patient is alert awake oriented and in no distress.  Past Medical History:   Diagnosis Date   Adjustment disorder with depressed mood 06/16/2019   BMI 31.0-31.9,adult 10/14/2019   Combined hyperlipidemia associated with type 2 diabetes mellitus ([place]) 06/16/2019   Coronary artery disease involving native coronary artery of native heart without angina pectoris 12/19/2009   Qualifier: Diagnosis of  By: Orville Govern, CMA, Carol     Coronary atherosclerosis of native coronary artery    2007 catheterization D1 70%, ramus intermediate 40%, large RV branch off the right coronary artery 60-70%.; Lexiscan Myoview (03/2013):  No ischemia, EF 58% (normal study).   Enlarged prostate with urinary obstruction 06/16/2019   Essential hypertension 12/30/2008   Qualifier: Diagnosis of  By: Orville Govern, CMA, Carol     Hyperlipidemia    Mixed hyperlipidemia 12/30/2008   Qualifier: Diagnosis of  By: Orville Govern, CMA, Carol     Reflux esophagitis 06/16/2019   Routine general medical examination at a health care facility 03/22/2020    Past Surgical History:  Procedure Laterality Date   ANGIOPLASTY  1994   CARDIAC SURGERY     TONSILLECTOMY AND ADENOIDECTOMY      Current Medications: No outpatient medications have been marked as taking for the 01/03/22 encounter (Appointment) with Raymonda Pell, Reita Cliche, MD.     Allergies:   Patient has no known allergies.   Social History   Socioeconomic History   Marital status: Widowed    Spouse name: Not on file   Number of children: 0   Years of education: Not on file   Highest education level: Not on file  Occupational History   Occupation: farmer   Occupation: Retired Development worker, community  Tobacco Use   Smoking status: Former    Types: Cigarettes    Quit date: 1994    Years since quitting: 29.6   Smokeless tobacco: Current    Types: Chew   Tobacco comments:    <1 can a week  Vaping Use   Vaping Use: Never used  Substance and Sexual Activity   Alcohol use: Never   Drug use: Never   Sexual activity: Not Currently  Other Topics Concern   Not on file  Social History  Narrative   Has one step son   Social Determinants of Health   Financial Resource Strain: Low Risk  (03/22/2020)   Overall Financial Resource Strain (CARDIA)    Difficulty of Paying Living Expenses: Not hard at all  Food Insecurity: No Food Insecurity (07/25/2021)   Hunger Vital Sign    Worried About Running Out of Food in the Last Year: Never true    [place] in the Last Year: Never true  Transportation Needs: No Transportation Needs (07/25/2021)   PRAPARE - Hydrologist (Medical): No    Lack of Transportation (Non-Medical): No  Physical Activity: Sufficiently Active (03/22/2020)   Exercise Vital Sign    Days of Exercise per Week: 3 days    Minutes of Exercise per Session: 60 min  Stress: No Stress Concern Present (07/25/2021)   [place]    Feeling of Stress : Not at all  Social Connections: Moderately Isolated (03/22/2020)   Social Connection and Isolation Panel [NHANES]    Frequency of Communication with Friends and Family: Twice a week    Frequency of Social Gatherings with Friends and Family: Twice a week    Attends Religious Services: More than 4 times per year    Active Member of [employer] or Organizations: No    Attends Archivist Meetings: Never    Marital Status: Widowed     Family History: The patient's family history includes Cancer in his brother; Coronary artery disease in his mother and another family member; Diabetes in his brother and father; Drug abuse in his brother; Heart attack in his mother; Hypertension in his father; Lung cancer in his father.  ROS:   Please see the history of present illness.    All other systems reviewed and are negative.  EKGs/Labs/Other Studies Reviewed:    The following studies were reviewed today: EKG reveals sinus rhythm and nonspecific ST-T changes   Recent Labs: 07/16/2021: TSH 1.180 11/20/2021: ALT 19; BUN 15;  Creatinine, Ser 1.06; Hemoglobin 13.3; Platelets 232; Potassium 5.2; Sodium 130  Recent Lipid Panel    Component Value Date/Time   CHOL 144 11/20/2021 0845   TRIG 99 11/20/2021 0845   HDL 57 11/20/2021 0845   CHOLHDL 2.5 11/20/2021 0845   CHOLHDL 1.8 11/26/2015 1511   VLDL 12 11/26/2015 1511   LDLCALC 69 11/20/2021 0845    Physical Exam:    VS:  There were no vitals taken for this visit.    Wt Readings from Last 3 Encounters:  11/20/21 191 lb (86.6 kg)  08/06/21 187 lb (84.8 kg)  07/25/21 188 lb 12.8 oz (85.6 kg)     GEN: Patient is in no acute distress HEENT: Normal NECK: No JVD; No carotid bruits LYMPHATICS: No lymphadenopathy CARDIAC: Hear sounds regular, 2/6 systolic murmur at the apex. RESPIRATORY:  Clear to auscultation without  rales, wheezing or rhonchi  ABDOMEN: Soft, non-tender, non-distended MUSCULOSKELETAL:  No edema; No deformity  SKIN: Warm and dry NEUROLOGIC:  Alert and oriented x 3 PSYCHIATRIC:  Normal affect   Signed, Jenean Lindau, MD  01/03/2022 1:01 PM     [facility]

## 2022-01-03 NOTE — Patient Instructions (Signed)

## 2022-01-27 DIAGNOSIS — E119 Type 2 diabetes mellitus without complications: Secondary | ICD-10-CM | POA: Diagnosis not present

## 2022-01-27 DIAGNOSIS — E1169 Type 2 diabetes mellitus with other specified complication: Secondary | ICD-10-CM | POA: Diagnosis not present

## 2022-01-31 ENCOUNTER — Ambulatory Visit (INDEPENDENT_AMBULATORY_CARE_PROVIDER_SITE_OTHER): Payer: Medicare Other | Admitting: Nurse Practitioner

## 2022-01-31 ENCOUNTER — Other Ambulatory Visit: Payer: Self-pay | Admitting: Nurse Practitioner

## 2022-01-31 ENCOUNTER — Encounter: Payer: Self-pay | Admitting: Nurse Practitioner

## 2022-01-31 VITALS — BP 128/68 | HR 72 | Temp 97.1°F | Ht 63.0 in | Wt 191.0 lb

## 2022-01-31 DIAGNOSIS — N390 Urinary tract infection, site not specified: Secondary | ICD-10-CM | POA: Diagnosis not present

## 2022-01-31 DIAGNOSIS — R339 Retention of urine, unspecified: Secondary | ICD-10-CM

## 2022-01-31 DIAGNOSIS — R300 Dysuria: Secondary | ICD-10-CM | POA: Diagnosis not present

## 2022-01-31 DIAGNOSIS — R1031 Right lower quadrant pain: Secondary | ICD-10-CM | POA: Diagnosis not present

## 2022-01-31 DIAGNOSIS — Z79899 Other long term (current) drug therapy: Secondary | ICD-10-CM | POA: Diagnosis not present

## 2022-01-31 DIAGNOSIS — R1032 Left lower quadrant pain: Secondary | ICD-10-CM | POA: Diagnosis not present

## 2022-01-31 LAB — POCT URINALYSIS DIP (CLINITEK)
Bilirubin, UA: NEGATIVE
Glucose, UA: 100 mg/dL — AB
Ketones, POC UA: NEGATIVE mg/dL
Leukocytes, UA: NEGATIVE
Nitrite, UA: NEGATIVE
POC PROTEIN,UA: NEGATIVE
Spec Grav, UA: 1.01 (ref 1.010–1.025)
Urobilinogen, UA: 0.2 E.U./dL
pH, UA: 7 (ref 5.0–8.0)

## 2022-01-31 MED ORDER — ALFUZOSIN HCL ER 10 MG PO TB24: 10.0000 mg | ORAL_TABLET | Freq: Every day | ORAL | 1 refills | Status: AC

## 2022-01-31 NOTE — Progress Notes (Signed)
Acute Office Visit  Subjective:    Patient ID: Lance Riddle, male    DOB: Oct 25, 1945, 76 y.o.   MRN: [id number]  Chief Complaint  Patient presents with   Urinary Tract Infection    HPI: Patient is in today for Urinary symptoms  He reports new onset dysuria, urinary frequency, and urinary retention. The current episode started a few days ago and is staying constant. Patient states symptoms are moderate in intensity, occurring constantly. He  has not been recently treated for similar symptoms. States he may have had fever and chills,  " but not bad".  Pt has history of BPH. Currently treated with Proscar 5 mg and Flomax 0.4 mg.       Past Medical History:  Diagnosis Date   Adjustment disorder with depressed mood 06/16/2019   BMI 31.0-31.9,adult 10/14/2019   Combined hyperlipidemia associated with type 2 diabetes mellitus ([place]) 06/16/2019   Coronary artery disease involving native coronary artery of native heart without angina pectoris 12/19/2009   Qualifier: Diagnosis of  By: Orville Govern, CMA, Carol     Coronary atherosclerosis of native coronary artery    2007 catheterization D1 70%, ramus intermediate 40%, large RV branch off the right coronary artery 60-70%.; Lexiscan Myoview (03/2013):  No ischemia, EF 58% (normal study).   Enlarged prostate with urinary obstruction 06/16/2019   Essential hypertension 12/30/2008   Qualifier: Diagnosis of  By: Orville Govern, CMA, Carol     Hyperlipidemia    Mixed hyperlipidemia 12/30/2008   Qualifier: Diagnosis of  By: Orville Govern, CMA, Carol     Reflux esophagitis 06/16/2019   Routine general medical examination at a health care facility 03/22/2020    Past Surgical History:  Procedure Laterality Date   ANGIOPLASTY  1994   CARDIAC SURGERY     TONSILLECTOMY AND ADENOIDECTOMY      Family History  Problem Relation Age of Onset   Heart attack Mother    Coronary artery disease Mother    Lung cancer Father    Diabetes Father    Hypertension Father    Cancer Brother     Coronary artery disease Other        paternal side of family   Diabetes Brother    Drug abuse Brother     Social History   Socioeconomic History   Marital status: Widowed    Spouse name: Not on file   Number of children: 0   Years of education: Not on file   Highest education level: Not on file  Occupational History   Occupation: farmer   Occupation: Retired Development worker, community  Tobacco Use   Smoking status: Former    Types: Cigarettes    Quit date: 1994    Years since quitting: 29.7    Passive exposure: Past   Smokeless tobacco: Current    Types: Chew   Tobacco comments:    <1 can a week  Vaping Use   Vaping Use: Never used  Substance and Sexual Activity   Alcohol use: Never   Drug use: Never   Sexual activity: Not Currently  Other Topics Concern   Not on file  Social History Narrative   Has one step son   Social Determinants of Health   Financial Resource Strain: Low Risk  (03/22/2020)   Overall Financial Resource Strain (CARDIA)    Difficulty of Paying Living Expenses: Not hard at all  Food Insecurity: No Food Insecurity (07/25/2021)   Hunger Vital Sign    Worried About Running Out of Food  in the Last Year: Never true    [place] in the Last Year: Never true  Transportation Needs: No Transportation Needs (07/25/2021)   PRAPARE - Hydrologist (Medical): No    Lack of Transportation (Non-Medical): No  Physical Activity: Sufficiently Active (03/22/2020)   Exercise Vital Sign    Days of Exercise per Week: 3 days    Minutes of Exercise per Session: 60 min  Stress: No Stress Concern Present (07/25/2021)   [place]    Feeling of Stress : Not at all  Social Connections: Moderately Isolated (03/22/2020)   Social Connection and Isolation Panel [NHANES]    Frequency of Communication with Friends and Family: Twice a week    Frequency of Social Gatherings with Friends and  Family: Twice a week    Attends Religious Services: More than 4 times per year    Active Member of [employer] or Organizations: No    Attends Archivist Meetings: Never    Marital Status: Widowed  Intimate Partner Violence: Not At Risk (07/25/2021)   Humiliation, Afraid, Rape, and Kick questionnaire    Fear of Current or Ex-Partner: No    Emotionally Abused: No    Physically Abused: No    Sexually Abused: No    Outpatient Medications Prior to Visit  Medication Sig Dispense Refill   amLODipine (NORVASC) 10 MG tablet TAKE 1 TABLET BY MOUTH ONCE  DAILY 100 tablet 0   aspirin 81 MG tablet Take 81 mg by mouth daily.     finasteride (PROSCAR) 5 MG tablet Take 1 tablet (5 mg total) by mouth daily. 90 tablet 3   glimepiride (AMARYL) 4 MG tablet TAKE 1 TABLET BY MOUTH DAILY 100 tablet 2   LINZESS 72 MCG capsule TAKE 1 CAPSULE BY MOUTH DAILY  BEFORE BREAKFAST 90 capsule 3   lisinopril-hydrochlorothiazide (ZESTORETIC) 20-12.5 MG tablet TAKE 1 TABLET BY MOUTH DAILY 100 tablet 2   metFORMIN (GLUCOPHAGE) 500 MG tablet TAKE 1 TABLET BY MOUTH TWICE  DAILY 200 tablet 2   nitroGLYCERIN (NITROSTAT) 0.4 MG SL tablet Place 1 tablet (0.4 mg total) under the tongue every 5 (five) minutes as needed for chest pain. 25 tablet 3   omeprazole (PRILOSEC) 20 MG capsule TAKE 1 CAPSULE BY MOUTH DAILY 100 capsule 2   simvastatin (ZOCOR) 40 MG tablet TAKE 1 TABLET BY MOUTH ONCE  DAILY 100 tablet 0   tamsulosin (FLOMAX) 0.4 MG CAPS capsule TAKE 1 CAPSULE BY MOUTH ONCE  DAILY 100 capsule 2   No facility-administered medications prior to visit.    No Known Allergies  Review of Systems See pertinent positives and negatives per HPI.     Objective:    Physical Exam Vitals reviewed.  Constitutional:      Appearance: He is ill-appearing.  Cardiovascular:     Rate and Rhythm: Normal rate and regular rhythm.     Pulses: Normal pulses.     Heart sounds: Normal heart sounds.  Pulmonary:     Effort: Pulmonary  effort is normal.     Breath sounds: Normal breath sounds.  Abdominal:     Palpations: Abdomen is soft.     Tenderness: There is abdominal tenderness (suprapubic).  Skin:    Capillary Refill: Capillary refill takes less than 2 seconds.  Neurological:     General: No focal deficit present.     Mental Status: He is alert and oriented to person, place,  and time.  Psychiatric:        Mood and Affect: Mood normal.        Behavior: Behavior normal.     BP 128/68   Pulse 72   Temp (!) 97.1 F (36.2 C)   Ht '5\' 3"'  (1.6 m)   Wt 191 lb (86.6 kg)   SpO2 100%   BMI 33.83 kg/m   Wt Readings from Last 3 Encounters:  01/31/22 191 lb (86.6 kg)  01/03/22 191 lb (86.6 kg)  11/20/21 191 lb (86.6 kg)    Health Maintenance Due  Topic Date Due   Zoster Vaccines- Shingrix (1 of 2) Never done   Pneumonia Vaccine 63+ Years old (2 - PCV) 04/09/2018   COVID-19 Vaccine (4 - Moderna series) 07/25/2020   TETANUS/TDAP  06/15/2021   INFLUENZA VACCINE  12/10/2021       Lab Results  Component Value Date   TSH 1.180 07/16/2021   Lab Results  Component Value Date   WBC 8.1 11/20/2021   HGB 13.3 11/20/2021   HCT 41.4 11/20/2021   MCV 89 11/20/2021   PLT 232 11/20/2021   Lab Results  Component Value Date   NA 130 (L) 11/20/2021   K 5.2 11/20/2021   CO2 20 11/20/2021   GLUCOSE 164 (H) 11/20/2021   BUN 15 11/20/2021   CREATININE 1.06 11/20/2021   BILITOT 0.3 11/20/2021   ALKPHOS 45 11/20/2021   AST 13 11/20/2021   ALT 19 11/20/2021   PROT 7.2 11/20/2021   ALBUMIN 4.7 11/20/2021   CALCIUM 10.0 11/20/2021   EGFR 74 11/20/2021   Lab Results  Component Value Date   CHOL 144 11/20/2021   Lab Results  Component Value Date   HDL 57 11/20/2021   Lab Results  Component Value Date   LDLCALC 69 11/20/2021   Lab Results  Component Value Date   TRIG 99 11/20/2021   Lab Results  Component Value Date   CHOLHDL 2.5 11/20/2021   Lab Results  Component Value Date   HGBA1C 7.7 (H)  11/20/2021       Assessment & Plan:   1. Urinary retention - Ambulatory referral to Urology - alfuzosin (UROXATRAL) 10 MG 24 hr tablet; Take 1 tablet (10 mg total) by mouth daily with breakfast.  Dispense: 90 tablet; Refill: 1 - Measure post void residual - In and Out Cath: 400 ml clear yellow urine emptied from bladder -keep appt for urologist for 12:30 today  2. Dysuria - POCT URINALYSIS DIP (CLINITEK) - Ambulatory referral to Urology - alfuzosin (UROXATRAL) 10 MG 24 hr tablet; Take 1 tablet (10 mg total) by mouth daily with breakfast.  Dispense: 90 tablet; Refill: 1        Follow-up: PRN  An After Visit Summary was printed and given to the patient.  I, Rip Harbour, NP, have reviewed all documentation for this visit. The documentation on 01/31/22 for the exam, diagnosis, procedures, and orders are all accurate and complete.   Rip Harbour, NP [place] [phone]

## 2022-01-31 NOTE — Patient Instructions (Signed)
Acute Urinary Retention, Male  Acute urinary retention is when a person cannot pee (urinate) at all, or can only pee a little. This can come on all of a sudden. If it is not treated, it can lead to kidney problems or other serious problems. What are the causes? A problem with the tube that drains the bladder (urethra). Problems with the nerves in the bladder. Tumors. Certain medicines. An infection. Having trouble pooping (constipation). What increases the risk? Older men are more at risk because their prostate gland may become larger as they age. Other conditions also can increase risk. These include: Diseases, such as multiple sclerosis. Injury to the spinal cord. Diabetes. A condition that affects the way the brain works, such as dementia. Holding back urine due to trauma or because you do not want to use the bathroom. What are the signs or symptoms? Trouble peeing. Pain in the lower belly. How is this treated? Treatment for this condition may include: Medicines. Placing a thin, germ-free tube (catheter) into the bladder to drain pee out of the body. Therapy to treat mental health conditions. Treatment for conditions that may cause this. If needed, you may be treated in the hospital for kidney problems or to manage other problems. Follow these instructions at home: Medicines Take over-the-counter and prescription medicines only as told by your doctor. Ask your doctor what medicines you should stay away from. If you were given an antibiotic medicine, take it as told by your doctor. Do not stop taking it, even if you start to feel better. General instructions Do not smoke or use any products that contain nicotine or tobacco. If you need help quitting, ask your doctor. Drink enough fluid to keep your pee pale yellow. If you were sent home with a tube that drains the bladder, take care of it as told by your doctor. Watch for changes in your symptoms. Tell your doctor about them. If  told, keep track of changes in your blood pressure at home. Tell your doctor about them. Keep all follow-up visits. Contact a doctor if: You have spasms in your bladder that you cannot stop. You leak pee when you have spasms. Get help right away if: You have chills or a fever. You have blood in your pee. You have a tube that drains pee from the bladder and these things happen: The tube stops draining pee. The tube falls out. Summary Acute urinary retention is when you cannot pee at all or you pee too little. If this condition is not treated, it can lead to kidney problems or other serious problems. If you were sent home with a tube (catheter) that drains the bladder, take care of it as told by your doctor. Watch for changes in your symptoms. Tell your doctor about them. This information is not intended to replace advice given to you by your health care provider. Make sure you discuss any questions you have with your health care provider. Document Revised: 01/18/2020 Document Reviewed: 01/18/2020 Elsevier Patient Education  2023 Elsevier Inc.  

## 2022-02-04 LAB — URINE CULTURE

## 2022-02-05 DIAGNOSIS — R109 Unspecified abdominal pain: Secondary | ICD-10-CM | POA: Diagnosis not present

## 2022-02-05 DIAGNOSIS — K760 Fatty (change of) liver, not elsewhere classified: Secondary | ICD-10-CM | POA: Diagnosis not present

## 2022-02-05 DIAGNOSIS — M419 Scoliosis, unspecified: Secondary | ICD-10-CM | POA: Diagnosis not present

## 2022-02-05 DIAGNOSIS — R339 Retention of urine, unspecified: Secondary | ICD-10-CM | POA: Diagnosis not present

## 2022-02-05 DIAGNOSIS — R1032 Left lower quadrant pain: Secondary | ICD-10-CM | POA: Diagnosis not present

## 2022-02-06 DIAGNOSIS — R339 Retention of urine, unspecified: Secondary | ICD-10-CM | POA: Diagnosis not present

## 2022-02-24 ENCOUNTER — Encounter: Payer: Self-pay | Admitting: Nurse Practitioner

## 2022-02-24 ENCOUNTER — Ambulatory Visit (INDEPENDENT_AMBULATORY_CARE_PROVIDER_SITE_OTHER): Payer: Medicare Other | Admitting: Nurse Practitioner

## 2022-02-24 VITALS — BP 130/80 | HR 76 | Temp 97.2°F | Ht 63.0 in | Wt 189.0 lb

## 2022-02-24 DIAGNOSIS — K219 Gastro-esophageal reflux disease without esophagitis: Secondary | ICD-10-CM | POA: Diagnosis not present

## 2022-02-24 DIAGNOSIS — I10 Essential (primary) hypertension: Secondary | ICD-10-CM | POA: Diagnosis not present

## 2022-02-24 DIAGNOSIS — E1165 Type 2 diabetes mellitus with hyperglycemia: Secondary | ICD-10-CM | POA: Diagnosis not present

## 2022-02-24 DIAGNOSIS — Z6833 Body mass index (BMI) 33.0-33.9, adult: Secondary | ICD-10-CM

## 2022-02-24 DIAGNOSIS — E782 Mixed hyperlipidemia: Secondary | ICD-10-CM

## 2022-02-24 DIAGNOSIS — I2510 Atherosclerotic heart disease of native coronary artery without angina pectoris: Secondary | ICD-10-CM

## 2022-02-24 DIAGNOSIS — R3914 Feeling of incomplete bladder emptying: Secondary | ICD-10-CM

## 2022-02-24 DIAGNOSIS — E1169 Type 2 diabetes mellitus with other specified complication: Secondary | ICD-10-CM

## 2022-02-24 DIAGNOSIS — E6609 Other obesity due to excess calories: Secondary | ICD-10-CM

## 2022-02-24 DIAGNOSIS — R110 Nausea: Secondary | ICD-10-CM | POA: Diagnosis not present

## 2022-02-24 DIAGNOSIS — Z23 Encounter for immunization: Secondary | ICD-10-CM

## 2022-02-24 DIAGNOSIS — N401 Enlarged prostate with lower urinary tract symptoms: Secondary | ICD-10-CM

## 2022-02-24 MED ORDER — ONDANSETRON HCL 4 MG PO TABS: 4.0000 mg | ORAL_TABLET | Freq: Three times a day (TID) | ORAL | 0 refills | Status: AC | PRN

## 2022-02-24 MED ORDER — OZEMPIC (0.25 OR 0.5 MG/DOSE) 2 MG/3ML ~~LOC~~ SOPN: 0.5000 mg | PEN_INJECTOR | SUBCUTANEOUS | 1 refills | Status: DC

## 2022-02-24 MED ORDER — OZEMPIC (0.25 OR 0.5 MG/DOSE) 2 MG/3ML ~~LOC~~ SOPN: 0.2500 mg | PEN_INJECTOR | SUBCUTANEOUS | 0 refills | Status: AC

## 2022-02-24 NOTE — Progress Notes (Signed)
Subjective:  Patient ID: Lance Riddle, male    DOB: 15-Apr-1946  Age: 76 y.o. MRN: [id number]  Chief Complaint  Patient presents with   Diabetes   Hyperlipidemia   Hypertension    HPI Pt presents for f/u of CAD, type 2 DM, HTN, and hyperlipidemia. Reports he had recent UTI. Was treated with a course of Macrobid by urology, symptoms have subsided. History of BPH, treated with Flomax 0.4 mg and Uroxatrol 10 mg.   [place] has history of CAD. He is followed by Dr Geraldo Pitter, cardiologist. Last visit 01/03/22. Stressed diet and physical activity modification and weight loss.  Coronary artery disease, follow up  He is taking daily aspirin. Prescribed Zocor 40 mg, Zestoretic, and Amlodipine He reports good compliance with treatment. He is not having side effects.  He is not having to take nitroglycerine. He is experiencing none. He is not experiencing none. He is able to carry groceries,     is able to climb stairs,      is able to cut grass,      is able to work in the yard without having above symptoms. His last vist with his cardiologist was 01/03/22  with Dr Geraldo Pitter.  Lipid Panel     Component Value Date/Time   CHOL 144 11/20/2021 0845   TRIG 99 11/20/2021 0845   HDL 57 11/20/2021 0845   CHOLHDL 2.5 11/20/2021 0845   CHOLHDL 1.8 11/26/2015 1511   VLDL 12 11/26/2015 1511   LDLCALC 69 11/20/2021 9449   Last metabolic panel Lab Results  Component Value Date   GLUCOSE 164 (H) 11/20/2021   NA 130 (L) 11/20/2021   K 5.2 11/20/2021   CL 95 (L) 11/20/2021   CO2 20 11/20/2021   BUN 15 11/20/2021   CREATININE 1.06 11/20/2021   GFRNONAA 65 06/26/2020   GFRAA 75 06/26/2020   CALCIUM 10.0 11/20/2021   PROT 7.2 11/20/2021   ALBUMIN 4.7 11/20/2021   LABGLOB 2.5 11/20/2021   AGRATIO 1.9 11/20/2021   BILITOT 0.3 11/20/2021   ALKPHOS 45 11/20/2021   AST 13 11/20/2021   ALT 19 11/20/2021       Diabetes Mellitus Type II, follow-up  Lab Results  Component Value Date    HGBA1C 7.7 (H) 11/20/2021   HGBA1C 7.7 (H) 07/16/2021   HGBA1C 6.6 (H) 03/18/2021   Last seen for diabetes 3 months ago.  Management since then includes metformin, glimepiride He reports excellent compliance with treatment. He is not having side effects.  Most Recent Eye Exam: scheduled 03/2022  --------------------------------------------------------------------------------------------------- Hypertension, follow-up  BP Readings from Last 3 Encounters:  01/31/22 128/68  01/03/22 116/66  11/20/21 134/64   Wt Readings from Last 3 Encounters:  02/24/22 189 lb (85.7 kg)  01/31/22 191 lb (86.6 kg)  01/03/22 191 lb (86.6 kg)     He was last seen for hypertension 3 months ago.  BP at that visit was 134/64. Management since that visit includes Amlodipine, Lisinopril. He reports excellent compliance with treatment. He is not having side effects.  He does not smoke.   --------------------------------------------------------------------------------------------------- Lipid/Cholesterol, follow-up  Last Lipid Panel: Lab Results  Component Value Date   CHOL 144 11/20/2021   LDLCALC 69 11/20/2021   HDL 57 11/20/2021   TRIG 99 11/20/2021    He was last seen for this 3 months ago.  Management since that visit includes simvastatin. He reports excellent compliance with treatment. He is not having side effects.  He is following a Regular diet. Current  exercise: none  Last metabolic panel Lab Results  Component Value Date   GLUCOSE 164 (H) 11/20/2021   NA 130 (L) 11/20/2021   K 5.2 11/20/2021   BUN 15 11/20/2021   CREATININE 1.06 11/20/2021   GFRNONAA 65 06/26/2020   GFRAA 75 06/26/2020   CALCIUM 10.0 11/20/2021   AST 13 11/20/2021   ALT 19 11/20/2021   The 10-year ASCVD risk score (Arnett DK, et al., 2019) is: 39.6%  ---------------------------------------------------------------------------------------------------  Current Outpatient Medications on File Prior to  Visit  Medication Sig Dispense Refill   alfuzosin (UROXATRAL) 10 MG 24 hr tablet Take 1 tablet (10 mg total) by mouth daily with breakfast. 90 tablet 1   amLODipine (NORVASC) 10 MG tablet TAKE 1 TABLET BY MOUTH ONCE  DAILY 100 tablet 0   aspirin 81 MG tablet Take 81 mg by mouth daily.     bethanechol (URECHOLINE) 25 MG tablet Take 25 mg by mouth in the morning and at bedtime.     finasteride (PROSCAR) 5 MG tablet Take 1 tablet (5 mg total) by mouth daily. 90 tablet 3   glimepiride (AMARYL) 4 MG tablet TAKE 1 TABLET BY MOUTH DAILY 100 tablet 2   LINZESS 72 MCG capsule TAKE 1 CAPSULE BY MOUTH DAILY  BEFORE BREAKFAST 90 capsule 3   lisinopril-hydrochlorothiazide (ZESTORETIC) 20-12.5 MG tablet TAKE 1 TABLET BY MOUTH DAILY 100 tablet 2   metFORMIN (GLUCOPHAGE) 500 MG tablet TAKE 1 TABLET BY MOUTH TWICE  DAILY 200 tablet 2   omeprazole (PRILOSEC) 20 MG capsule TAKE 1 CAPSULE BY MOUTH DAILY 100 capsule 2   simvastatin (ZOCOR) 40 MG tablet TAKE 1 TABLET BY MOUTH ONCE  DAILY 100 tablet 0   nitroGLYCERIN (NITROSTAT) 0.4 MG SL tablet Place 1 tablet (0.4 mg total) under the tongue every 5 (five) minutes as needed for chest pain. 25 tablet 3   No current facility-administered medications on file prior to visit.   Past Medical History:  Diagnosis Date   Adjustment disorder with depressed mood 06/16/2019   BMI 31.0-31.9,adult 10/14/2019   Combined hyperlipidemia associated with type 2 diabetes mellitus ([place]) 06/16/2019   Coronary artery disease involving native coronary artery of native heart without angina pectoris 12/19/2009   Qualifier: Diagnosis of  By: Orville Govern, CMA, Carol     Coronary atherosclerosis of native coronary artery    2007 catheterization D1 70%, ramus intermediate 40%, large RV branch off the right coronary artery 60-70%.; Lexiscan Myoview (03/2013):  No ischemia, EF 58% (normal study).   Enlarged prostate with urinary obstruction 06/16/2019   Essential hypertension 12/30/2008   Qualifier:  Diagnosis of  By: Orville Govern, CMA, Carol     Hyperlipidemia    Mixed hyperlipidemia 12/30/2008   Qualifier: Diagnosis of  By: Orville Govern, CMA, Carol     Reflux esophagitis 06/16/2019   Routine general medical examination at a health care facility 03/22/2020   Past Surgical History:  Procedure Laterality Date   ANGIOPLASTY  1994   CARDIAC SURGERY     TONSILLECTOMY AND ADENOIDECTOMY      Family History  Problem Relation Age of Onset   Heart attack Mother    Coronary artery disease Mother    Lung cancer Father    Diabetes Father    Hypertension Father    Cancer Brother    Coronary artery disease Other        paternal side of family   Diabetes Brother    Drug abuse Brother    Social History   Socioeconomic  History   Marital status: Widowed    Spouse name: Not on file   Number of children: 0   Years of education: Not on file   Highest education level: Not on file  Occupational History   Occupation: farmer   Occupation: Retired Development worker, community  Tobacco Use   Smoking status: Former    Types: Cigarettes    Quit date: 1994    Years since quitting: 29.8    Passive exposure: Past   Smokeless tobacco: Current    Types: Chew   Tobacco comments:    <1 can a week  Vaping Use   Vaping Use: Never used  Substance and Sexual Activity   Alcohol use: Never   Drug use: Never   Sexual activity: Not Currently  Other Topics Concern   Not on file  Social History Narrative   Has one step son   Social Determinants of Health   Financial Resource Strain: Low Risk  (03/22/2020)   Overall Financial Resource Strain (CARDIA)    Difficulty of Paying Living Expenses: Not hard at all  Food Insecurity: No Food Insecurity (07/25/2021)   Hunger Vital Sign    Worried About Running Out of Food in the Last Year: Never true    [place] in the Last Year: Never true  Transportation Needs: No Transportation Needs (07/25/2021)   PRAPARE - Hydrologist (Medical): No    Lack of  Transportation (Non-Medical): No  Physical Activity: Sufficiently Active (03/22/2020)   Exercise Vital Sign    Days of Exercise per Week: 3 days    Minutes of Exercise per Session: 60 min  Stress: No Stress Concern Present (07/25/2021)   [place]    Feeling of Stress : Not at all  Social Connections: Moderately Isolated (03/22/2020)   Social Connection and Isolation Panel [NHANES]    Frequency of Communication with Friends and Family: Twice a week    Frequency of Social Gatherings with Friends and Family: Twice a week    Attends Religious Services: More than 4 times per year    Active Member of [employer] or Organizations: No    Attends Archivist Meetings: Never    Marital Status: Widowed    Review of Systems  Constitutional:  Negative for chills, diaphoresis, fatigue and fever.  HENT:  Positive for congestion and rhinorrhea. Negative for ear pain and sore throat.   Respiratory:  Negative for cough and shortness of breath.   Cardiovascular:  Negative for chest pain and leg swelling.  Gastrointestinal:  Negative for abdominal pain, constipation, diarrhea, nausea and vomiting.  Genitourinary:  Negative for dysuria and urgency.  Musculoskeletal:  Negative for arthralgias and myalgias.  Neurological:  Negative for dizziness and headaches.  Psychiatric/Behavioral:  Negative for dysphoric mood.      Objective:  BP 130/80   Pulse 76   Temp (!) 97.2 F (36.2 C)   Ht '5\' 3"'$  (1.6 m)   Wt 189 lb (85.7 kg)   SpO2 99%   BMI 33.48 kg/m       02/24/2022    9:01 AM 01/31/2022   10:07 AM 01/03/2022    1:09 PM  BP/Weight  Systolic BP  174 081  Diastolic BP  68 66  Wt. (Lbs) 189 191 191  BMI 33.48 kg/m2 33.83 kg/m2 33.83 kg/m2    Physical Exam Vitals reviewed.  Constitutional:      Appearance: He is obese.  HENT:     Right Ear: Tympanic membrane normal.     Left Ear: Tympanic membrane normal.     Nose:  Nose normal.     Mouth/Throat:     Mouth: Mucous membranes are moist.  Cardiovascular:     Rate and Rhythm: Normal rate and regular rhythm.     Pulses: Normal pulses.     Heart sounds: Normal heart sounds.  Pulmonary:     Effort: Pulmonary effort is normal.     Breath sounds: Normal breath sounds.  Abdominal:     General: Bowel sounds are normal.     Palpations: Abdomen is soft.  Skin:    General: Skin is warm and dry.     Capillary Refill: Capillary refill takes less than 2 seconds.  Neurological:     General: No focal deficit present.     Mental Status: He is alert and oriented to person, place, and time.  Psychiatric:        Mood and Affect: Mood normal.        Behavior: Behavior normal.    Lab Results  Component Value Date   WBC 8.1 11/20/2021   HGB 13.3 11/20/2021   HCT 41.4 11/20/2021   PLT 232 11/20/2021   GLUCOSE 164 (H) 11/20/2021   CHOL 144 11/20/2021   TRIG 99 11/20/2021   HDL 57 11/20/2021   LDLCALC 69 11/20/2021   ALT 19 11/20/2021   AST 13 11/20/2021   NA 130 (L) 11/20/2021   K 5.2 11/20/2021   CL 95 (L) 11/20/2021   CREATININE 1.06 11/20/2021   BUN 15 11/20/2021   CO2 20 11/20/2021   TSH 1.180 07/16/2021   HGBA1C 7.7 (H) 11/20/2021   MICROALBUR 10 10/23/2020      Assessment & Plan:   1. Coronary artery disease involving native coronary artery of native heart without angina pectoris-well controlled  - CBC with Differential/Platelet - Comprehensive metabolic panel - Hemoglobin A1c - Lipid panel -continue ASA, Zocor, Zestoretic,and Amlodipine as prescribed  2. Type 2 diabetes mellitus with hyperglycemia, without long-term current use of insulin (HCC)-not at goal - CBC with Differential/Platelet - Comprehensive metabolic panel - Hemoglobin A1c - Semaglutide,0.25 or 0.'5MG'$ /DOS, (OZEMPIC, 0.25 OR 0.5 MG/DOSE,) 2 MG/3ML SOPN; Inject 0.25 mg into the skin once a week.  Dispense: 3 mL; Refill: 0 - Semaglutide,0.25 or 0.'5MG'$ /DOS, (OZEMPIC, 0.25 OR  0.5 MG/DOSE,) 2 MG/3ML SOPN; Inject 0.5 mg into the skin once a week.  Dispense: 3 mL; Refill: 1 -continue Metformin and Glymepiride  -heart healthy diabetic diet  3. Essential hypertension-well controlled - CBC with Differential/Platelet - Comprehensive metabolic panel -continue Amlodipine and Zestoretic  4. Gastroesophageal reflux disease, unspecified whether esophagitis present-well controlled  - CBC with Differential/Platelet - Comprehensive metabolic panel -Omeprazole 20 mg daily -Avoid foods that trigger GERD  5. Combined hyperlipidemia associated with type 2 diabetes mellitus (HCC)-well controlled  - CBC with Differential/Platelet - Comprehensive metabolic panel - Hemoglobin A1c - Lipid panel -continue Zocor 40 mg QD  6. Class 1 obesity due to excess calories with serious comorbidity and body mass index (BMI) of 33.0 to 33.9 in adult - CBC with Differential/Platelet - Comprehensive metabolic panel - Hemoglobin A1c - Lipid panel -heart healthy diet  7. Need for immunization against influenza - Flu Vaccine QUAD High Dose(Fluad)  8. Nausea - ondansetron (ZOFRAN) 4 MG tablet; Take 1 tablet (4 mg total) by mouth every 8 (eight) hours as needed for nausea or vomiting.  Dispense: 20 tablet; Refill: 0  9. Benign prostatic hyperplasia with incomplete bladder emptying -continue follow-up with urology as scheduled -continue Flomax and Uroxatral as prescribed     Begin Ozempic 0.25 mg injection weekly for 4 weeks then increase to 0.5 mg injection weekly for 4 weeks Take Zofran 4 mg as needed for nausea Take Linzess as prescribed for constipation We will call you with lab results Follow-up in 84-month, fasting   Follow-up: 368-monthfasting  An After Visit Summary was printed and given to the patient.  I, ShRip HarbourNP, have reviewed all documentation for this visit. The documentation on 02/24/22 for the exam, diagnosis, procedures, and orders are all accurate and  complete.    Signed,  ShRip HarbourNP [phone]

## 2022-02-24 NOTE — Patient Instructions (Addendum)
Begin Ozempic 0.25 mg injection weekly for 4 weeks then increase to 0.5 mg injection weekly for 4 weeks Take Zofran 4 mg as needed for nausea Take Linzess as prescribed for constipation We will call you with lab results Follow-up in 6-month, fasting  Semaglutide Injection What is this medication? SEMAGLUTIDE (SEM a GLOO tide) treats type 2 diabetes. It works by increasing insulin levels in your body, which decreases your blood sugar (glucose). It also reduces the amount of sugar released into the blood and slows down your digestion. It can also be used to lower the risk of heart attack and stroke in people with type 2 diabetes. Changes to diet and exercise are often combined with this medication. This medicine may be used for other purposes; ask your health care provider or pharmacist if you have questions. COMMON BRAND NAME(S): OZEMPIC What should I tell my care team before I take this medication? They need to know if you have any of these conditions: Endocrine tumors (MEN 2) or if someone in your family had these tumors Eye disease, vision problems History of pancreatitis Kidney disease Stomach problems Thyroid cancer or if someone in your family had thyroid cancer An unusual or allergic reaction to semaglutide, other medications, foods, dyes, or preservatives Pregnant or trying to get pregnant Breast-feeding How should I use this medication? This medication is for injection under the skin of your upper leg (thigh), stomach area, or upper arm. It is given once every week (every 7 days). You will be taught how to prepare and give this medication. Use exactly as directed. Take your medication at regular intervals. Do not take it more often than directed. If you use this medication with insulin, you should inject this medication and the insulin separately. Do not mix them together. Do not give the injections right next to each other. Change (rotate) injection sites with each injection. It is  important that you put your used needles and syringes in a special sharps container. Do not put them in a trash can. If you do not have a sharps container, call your pharmacist or care team to get one. A special MedGuide will be given to you by the pharmacist with each prescription and refill. Be sure to read this information carefully each time. This medication comes with INSTRUCTIONS FOR USE. Ask your pharmacist for directions on how to use this medication. Read the information carefully. Talk to your pharmacist or care team if you have questions. Talk to your care team about the use of this medication in children. Special care may be needed. Overdosage: If you think you have taken too much of this medicine contact a poison control center or emergency room at once. NOTE: This medicine is only for you. Do not share this medicine with others. What if I miss a dose? If you miss a dose, take it as soon as you can within 5 days after the missed dose. Then take your next dose at your regular weekly time. If it has been longer than 5 days after the missed dose, do not take the missed dose. Take the next dose at your regular time. Do not take double or extra doses. If you have questions about a missed dose, contact your care team for advice. What may interact with this medication? Other medications for diabetes Many medications may cause changes in blood sugar, these include: Alcohol containing beverages Antiviral medications for HIV or AIDS Aspirin and aspirin-like medications Certain medications for blood pressure, heart disease, irregular  heart beat Chromium Diuretics Male hormones, such as estrogens or progestins, birth control pills Fenofibrate Gemfibrozil Isoniazid Lanreotide Male hormones or anabolic steroids MAOIs like Carbex, Eldepryl, Marplan, Nardil, and Parnate Medications for weight loss Medications for allergies, asthma, cold, or cough Medications for depression, anxiety, or  psychotic disturbances Niacin Nicotine NSAIDs, medications for pain and inflammation, like ibuprofen or naproxen Octreotide Pasireotide Pentamidine Phenytoin Probenecid Quinolone antibiotics such as ciprofloxacin, levofloxacin, ofloxacin Some herbal dietary supplements Steroid medications such as prednisone or cortisone Sulfamethoxazole; trimethoprim Thyroid hormones Some medications can hide the warning symptoms of low blood sugar (hypoglycemia). You may need to monitor your blood sugar more closely if you are taking one of these medications. These include: Beta-blockers, often used for high blood pressure or heart problems (examples include atenolol, metoprolol, propranolol) Clonidine Guanethidine Reserpine This list may not describe all possible interactions. Give your health care provider a list of all the medicines, herbs, non-prescription drugs, or dietary supplements you use. Also tell them if you smoke, drink alcohol, or use illegal drugs. Some items may interact with your medicine. What should I watch for while using this medication? Visit your care team for regular checks on your progress. Drink plenty of fluids while taking this medication. Check with your care team if you get an attack of severe diarrhea, nausea, and vomiting. The loss of too much body fluid can make it dangerous for you to take this medication. A test called the HbA1C (A1C) will be monitored. This is a simple blood test. It measures your blood sugar control over the last 2 to 3 months. You will receive this test every 3 to 6 months. Learn how to check your blood sugar. Learn the symptoms of low and high blood sugar and how to manage them. Always carry a quick-source of sugar with you in case you have symptoms of low blood sugar. Examples include hard sugar candy or glucose tablets. Make sure others know that you can choke if you eat or drink when you develop serious symptoms of low blood sugar, such as seizures  or unconsciousness. They must get medical help at once. Tell your care team if you have high blood sugar. You might need to change the dose of your medication. If you are sick or exercising more than usual, you might need to change the dose of your medication. Do not skip meals. Ask your care team if you should avoid alcohol. Many nonprescription cough and cold products contain sugar or alcohol. These can affect blood sugar. Pens should never be shared. Even if the needle is changed, sharing may result in passing of viruses like hepatitis or HIV. Wear a medical ID bracelet or chain, and carry a card that describes your disease and details of your medication and dosage times. Do not become pregnant while taking this medication. Women should inform their care team if they wish to become pregnant or think they might be pregnant. There is a potential for serious side effects to an unborn child. Talk to your care team for more information. What side effects may I notice from receiving this medication? Side effects that you should report to your care team as soon as possible: Allergic reactions--skin rash, itching, hives, swelling of the face, lips, tongue, or throat Change in vision Dehydration--increased thirst, dry mouth, feeling faint or lightheaded, headache, dark yellow or brown urine Gallbladder problems--severe stomach pain, nausea, vomiting, fever Heart palpitations--rapid, pounding, or irregular heartbeat Kidney injury--decrease in the amount of urine, swelling of the ankles,  hands, or feet Pancreatitis--severe stomach pain that spreads to your back or gets worse after eating or when touched, fever, nausea, vomiting Thyroid cancer--new mass or lump in the neck, pain or trouble swallowing, trouble breathing, hoarseness Side effects that usually do not require medical attention (report to your care team if they continue or are bothersome): Diarrhea Loss of appetite Nausea Stomach  pain Vomiting This list may not describe all possible side effects. Call your doctor for medical advice about side effects. You may report side effects to FDA at 1-800-FDA-1088. Where should I keep my medication? Keep out of the reach of children. Store unopened pens in a refrigerator between 2 and 8 degrees C (36 and 46 degrees F). Do not freeze. Protect from light and heat. After you first use the pen, it can be stored for 56 days at room temperature between 15 and 30 degrees C (59 and 86 degrees F) or in a refrigerator. Throw away your used pen after 56 days or after the expiration date, whichever comes first. Do not store your pen with the needle attached. If the needle is left on, medication may leak from the pen. NOTE: This sheet is a summary. It may not cover all possible information. If you have questions about this medicine, talk to your doctor, pharmacist, or health care provider.  2023 Elsevier/Gold Standard (2020-07-12 00:00:00)  Diabetes Mellitus and Nutrition, Adult When you have diabetes, or diabetes mellitus, it is very important to have healthy eating habits because your blood sugar (glucose) levels are greatly affected by what you eat and drink. Eating healthy foods in the right amounts, at about the same times every day, can help you: Manage your blood glucose. Lower your risk of heart disease. Improve your blood pressure. Reach or maintain a healthy weight. What can affect my meal plan? Every person with diabetes is different, and each person has different needs for a meal plan. Your health care provider may recommend that you work with a dietitian to make a meal plan that is best for you. Your meal plan may vary depending on factors such as: The calories you need. The medicines you take. Your weight. Your blood glucose, blood pressure, and cholesterol levels. Your activity level. Other health conditions you have, such as heart or kidney disease. How do carbohydrates  affect me? Carbohydrates, also called carbs, affect your blood glucose level more than any other type of food. Eating carbs raises the amount of glucose in your blood. It is important to know how many carbs you can safely have in each meal. This is different for every person. Your dietitian can help you calculate how many carbs you should have at each meal and for each snack. How does alcohol affect me? Alcohol can cause a decrease in blood glucose (hypoglycemia), especially if you use insulin or take certain diabetes medicines by mouth. Hypoglycemia can be a life-threatening condition. Symptoms of hypoglycemia, such as sleepiness, dizziness, and confusion, are similar to symptoms of having too much alcohol. Do not drink alcohol if: Your health care provider tells you not to drink. You are pregnant, may be pregnant, or are planning to become pregnant. If you drink alcohol: Limit how much you have to: 0-1 drink a day for women. 0-2 drinks a day for men. Know how much alcohol is in your drink. In the [place] one drink equals one 12 oz bottle of beer (355 mL), one 5 oz glass of wine (148 mL), or one 1 oz glass of  hard liquor (44 mL). Keep yourself hydrated with water, diet soda, or unsweetened iced tea. Keep in mind that regular soda, juice, and other mixers may contain a lot of sugar and must be counted as carbs. What are tips for following this plan?  Reading food labels Start by checking the serving size on the Nutrition Facts label of packaged foods and drinks. The number of calories and the amount of carbs, fats, and other nutrients listed on the label are based on one serving of the item. Many items contain more than one serving per package. Check the total grams (g) of carbs in one serving. Check the number of grams of saturated fats and trans fats in one serving. Choose foods that have a low amount or none of these fats. Check the number of milligrams (mg) of salt (sodium) in one serving.  Most people should limit total sodium intake to less than 2,300 mg per day. Always check the nutrition information of foods labeled as "low-fat" or "nonfat." These foods may be higher in added sugar or refined carbs and should be avoided. Talk to your dietitian to identify your daily goals for nutrients listed on the label. Shopping Avoid buying canned, pre-made, or processed foods. These foods tend to be high in fat, sodium, and added sugar. Shop around the outside edge of the grocery store. This is where you will most often find fresh fruits and vegetables, bulk grains, fresh meats, and fresh dairy products. Cooking Use low-heat cooking methods, such as baking, instead of high-heat cooking methods, such as deep frying. Cook using healthy oils, such as olive, canola, or sunflower oil. Avoid cooking with butter, cream, or high-fat meats. Meal planning Eat meals and snacks regularly, preferably at the same times every day. Avoid going long periods of time without eating. Eat foods that are high in fiber, such as fresh fruits, vegetables, beans, and whole grains. Eat 4-6 oz (112-168 g) of lean protein each day, such as lean meat, chicken, fish, eggs, or tofu. One ounce (oz) (28 g) of lean protein is equal to: 1 oz (28 g) of meat, chicken, or fish. 1 egg.  cup (62 g) of tofu. Eat some foods each day that contain healthy fats, such as avocado, nuts, seeds, and fish. What foods should I eat? Fruits Berries. Apples. Oranges. Peaches. Apricots. Plums. Grapes. Mangoes. Papayas. Pomegranates. Kiwi. Cherries. Vegetables Leafy greens, including lettuce, spinach, kale, chard, collard greens, mustard greens, and cabbage. Beets. Cauliflower. Broccoli. Carrots. Green beans. Tomatoes. Peppers. Onions. Cucumbers. Brussels sprouts. Grains Whole grains, such as whole-wheat or whole-grain bread, crackers, tortillas, cereal, and pasta. Unsweetened oatmeal. Quinoa. Brown or wild rice. Meats and other  proteins Seafood. Poultry without skin. Lean cuts of poultry and beef. Tofu. Nuts. Seeds. Dairy Low-fat or fat-free dairy products such as milk, yogurt, and cheese. The items listed above may not be a complete list of foods and beverages you can eat and drink. Contact a dietitian for more information. What foods should I avoid? Fruits Fruits canned with syrup. Vegetables Canned vegetables. Frozen vegetables with butter or cream sauce. Grains Refined white flour and flour products such as bread, pasta, snack foods, and cereals. Avoid all processed foods. Meats and other proteins Fatty cuts of meat. Poultry with skin. Breaded or fried meats. Processed meat. Avoid saturated fats. Dairy Full-fat yogurt, cheese, or milk. Beverages Sweetened drinks, such as soda or iced tea. The items listed above may not be a complete list of foods and beverages you should avoid.  Contact a dietitian for more information. Questions to ask a health care provider Do I need to meet with a certified diabetes care and education specialist? Do I need to meet with a dietitian? What number can I call if I have questions? When are the best times to check my blood glucose? Where to find more information: American Diabetes Association: diabetes.org Academy of Nutrition and Dietetics: eatright.[employer] of Diabetes and Digestive and Kidney Diseases: [employer] Association of Diabetes Care & Education Specialists: diabeteseducator.org Summary It is important to have healthy eating habits because your blood sugar (glucose) levels are greatly affected by what you eat and drink. It is important to use alcohol carefully. A healthy meal plan will help you manage your blood glucose and lower your risk of heart disease. Your health care provider may recommend that you work with a dietitian to make a meal plan that is best for you. This information is not intended to replace advice given to you by your health  care provider. Make sure you discuss any questions you have with your health care provider. Document Revised: 11/30/2019 Document Reviewed: 11/30/2019 Elsevier Patient Education  [employer].

## 2022-02-25 LAB — CBC WITH DIFFERENTIAL/PLATELET
Basophils Absolute: 0 10*3/uL (ref 0.0–0.2)
Basos: 0 %
EOS (ABSOLUTE): 0.3 10*3/uL (ref 0.0–0.4)
Eos: 4 %
Hematocrit: 40.5 % (ref 37.5–51.0)
Hemoglobin: 13.1 g/dL (ref 13.0–17.7)
Immature Grans (Abs): 0 10*3/uL (ref 0.0–0.1)
Immature Granulocytes: 0 %
Lymphocytes Absolute: 2.8 10*3/uL (ref 0.7–3.1)
Lymphs: 32 %
MCH: 28.7 pg (ref 26.6–33.0)
MCHC: 32.3 g/dL (ref 31.5–35.7)
MCV: 89 fL (ref 79–97)
Monocytes Absolute: 1.4 10*3/uL — ABNORMAL HIGH (ref 0.1–0.9)
Monocytes: 16 %
Neutrophils Absolute: 4.2 10*3/uL (ref 1.4–7.0)
Neutrophils: 48 %
Platelets: 223 10*3/uL (ref 150–450)
RBC: 4.56 x10E6/uL (ref 4.14–5.80)
RDW: 13.7 % (ref 11.6–15.4)
WBC: 8.7 10*3/uL (ref 3.4–10.8)

## 2022-02-25 LAB — LIPID PANEL
Chol/HDL Ratio: 2.5 ratio (ref 0.0–5.0)
Cholesterol, Total: 135 mg/dL (ref 100–199)
HDL: 54 mg/dL (ref >39)
LDL Chol Calc (NIH): 65 mg/dL (ref 0–99)
Triglycerides: 85 mg/dL (ref 0–149)
VLDL Cholesterol Cal: 16 mg/dL (ref 5–40)

## 2022-02-25 LAB — COMPREHENSIVE METABOLIC PANEL
ALT: 19 IU/L (ref 0–44)
AST: 15 IU/L (ref 0–40)
Albumin/Globulin Ratio: 1.8 (ref 1.2–2.2)
Albumin: 4.6 g/dL (ref 3.8–4.8)
Alkaline Phosphatase: 49 IU/L (ref 44–121)
BUN/Creatinine Ratio: 10 (ref 10–24)
BUN: 11 mg/dL (ref 8–27)
Bilirubin Total: <0.2 mg/dL (ref 0.0–1.2)
CO2: 24 mmol/L (ref 20–29)
Calcium: 9.7 mg/dL (ref 8.6–10.2)
Chloride: 95 mmol/L — ABNORMAL LOW (ref 96–106)
Creatinine, Ser: 1.12 mg/dL (ref 0.76–1.27)
Globulin, Total: 2.5 g/dL (ref 1.5–4.5)
Glucose: 179 mg/dL — ABNORMAL HIGH (ref 70–99)
Potassium: 5.3 mmol/L — ABNORMAL HIGH (ref 3.5–5.2)
Sodium: 133 mmol/L — ABNORMAL LOW (ref 134–144)
Total Protein: 7.1 g/dL (ref 6.0–8.5)
eGFR: 69 mL/min/{1.73_m2} (ref >59)

## 2022-02-25 LAB — HEMOGLOBIN A1C
Est. average glucose Bld gHb Est-mCnc: 186 mg/dL
Hgb A1c MFr Bld: 8.1 % — ABNORMAL HIGH (ref 4.8–5.6)

## 2022-02-25 LAB — CARDIOVASCULAR RISK ASSESSMENT

## 2022-03-03 DIAGNOSIS — B370 Candidal stomatitis: Secondary | ICD-10-CM | POA: Diagnosis not present

## 2022-03-03 DIAGNOSIS — N390 Urinary tract infection, site not specified: Secondary | ICD-10-CM | POA: Diagnosis not present

## 2022-03-03 DIAGNOSIS — R339 Retention of urine, unspecified: Secondary | ICD-10-CM | POA: Diagnosis not present

## 2022-03-27 DIAGNOSIS — Z7984 Long term (current) use of oral hypoglycemic drugs: Secondary | ICD-10-CM | POA: Diagnosis not present

## 2022-03-27 DIAGNOSIS — H2513 Age-related nuclear cataract, bilateral: Secondary | ICD-10-CM | POA: Diagnosis not present

## 2022-03-27 DIAGNOSIS — E119 Type 2 diabetes mellitus without complications: Secondary | ICD-10-CM | POA: Diagnosis not present

## 2022-04-01 ENCOUNTER — Other Ambulatory Visit: Payer: Self-pay | Admitting: Legal Medicine

## 2022-04-01 DIAGNOSIS — N401 Enlarged prostate with lower urinary tract symptoms: Secondary | ICD-10-CM

## 2022-04-04 ENCOUNTER — Other Ambulatory Visit: Payer: Self-pay | Admitting: Nurse Practitioner

## 2022-04-04 DIAGNOSIS — E1165 Type 2 diabetes mellitus with hyperglycemia: Secondary | ICD-10-CM

## 2022-04-22 DIAGNOSIS — E119 Type 2 diabetes mellitus without complications: Secondary | ICD-10-CM | POA: Diagnosis not present

## 2022-04-24 DIAGNOSIS — E119 Type 2 diabetes mellitus without complications: Secondary | ICD-10-CM | POA: Diagnosis not present

## 2022-04-24 DIAGNOSIS — E1169 Type 2 diabetes mellitus with other specified complication: Secondary | ICD-10-CM | POA: Diagnosis not present

## 2022-04-30 ENCOUNTER — Other Ambulatory Visit: Payer: Self-pay | Admitting: Family Medicine

## 2022-04-30 DIAGNOSIS — E1169 Type 2 diabetes mellitus with other specified complication: Secondary | ICD-10-CM

## 2022-05-06 ENCOUNTER — Other Ambulatory Visit: Payer: Self-pay | Admitting: Family Medicine

## 2022-05-06 DIAGNOSIS — I10 Essential (primary) hypertension: Secondary | ICD-10-CM

## 2022-05-12 ENCOUNTER — Telehealth: Payer: Self-pay | Admitting: Family Medicine

## 2022-05-16 ENCOUNTER — Other Ambulatory Visit: Payer: Self-pay | Admitting: Nurse Practitioner

## 2022-05-16 DIAGNOSIS — E1165 Type 2 diabetes mellitus with hyperglycemia: Secondary | ICD-10-CM

## 2022-06-05 ENCOUNTER — Ambulatory Visit: Payer: Medicare Other | Admitting: Nurse Practitioner

## 2022-06-12 NOTE — Telephone Encounter (Signed)
Patient was found down in his bathroom on 06/03/2022. Last seen Friday 05/09/2022. [employer] called. Based on his findings likely died in the last 47 hours. No foul play suspected. Body released. No autopsy recommended. I will fill out death certificate. Dr. Tobie Poet

## 2022-06-12 DEATH — deceased
